# Patient Record
Sex: Female | Born: 1950 | Race: White | Hispanic: No | Marital: Married
Health system: Midwestern US, Academic
[De-identification: ages and names within clinical notes are randomized; demographics above are authoritative.]

---

## 2016-12-04 ENCOUNTER — Encounter: Admit: 2016-12-04 | Discharge: 2016-12-05 | Payer: MEDICARE

## 2016-12-10 ENCOUNTER — Encounter: Admit: 2016-12-10 | Discharge: 2016-12-11 | Payer: MEDICARE

## 2016-12-23 ENCOUNTER — Encounter: Admit: 2016-12-23 | Discharge: 2016-12-23 | Payer: MEDICARE

## 2016-12-23 DIAGNOSIS — C50912 Malignant neoplasm of unspecified site of left female breast: Principal | ICD-10-CM

## 2016-12-26 ENCOUNTER — Encounter: Admit: 2016-12-26 | Discharge: 2016-12-26 | Payer: MEDICARE

## 2016-12-28 ENCOUNTER — Encounter: Admit: 2016-12-28 | Discharge: 2016-12-28 | Payer: MEDICARE

## 2016-12-29 ENCOUNTER — Encounter: Admit: 2016-12-29 | Discharge: 2016-12-29 | Payer: MEDICARE

## 2016-12-29 DIAGNOSIS — R69 Illness, unspecified: Principal | ICD-10-CM

## 2016-12-30 ENCOUNTER — Encounter: Admit: 2016-12-30 | Discharge: 2016-12-30 | Payer: MEDICARE

## 2016-12-30 ENCOUNTER — Ambulatory Visit: Admit: 2016-12-30 | Discharge: 2016-12-31 | Payer: MEDICARE

## 2016-12-30 ENCOUNTER — Ambulatory Visit: Admit: 2016-12-30 | Discharge: 2016-12-30 | Payer: MEDICARE

## 2016-12-30 DIAGNOSIS — C50912 Malignant neoplasm of unspecified site of left female breast: ICD-10-CM

## 2016-12-30 DIAGNOSIS — K319 Disease of stomach and duodenum, unspecified: ICD-10-CM

## 2016-12-30 DIAGNOSIS — Z87891 Personal history of nicotine dependence: ICD-10-CM

## 2016-12-30 DIAGNOSIS — G629 Polyneuropathy, unspecified: ICD-10-CM

## 2016-12-30 DIAGNOSIS — H547 Unspecified visual loss: ICD-10-CM

## 2016-12-30 DIAGNOSIS — M549 Dorsalgia, unspecified: ICD-10-CM

## 2016-12-30 DIAGNOSIS — I341 Nonrheumatic mitral (valve) prolapse: ICD-10-CM

## 2016-12-30 DIAGNOSIS — M797 Fibromyalgia: ICD-10-CM

## 2016-12-30 DIAGNOSIS — E119 Type 2 diabetes mellitus without complications: Principal | ICD-10-CM

## 2016-12-30 DIAGNOSIS — E8881 Metabolic syndrome: ICD-10-CM

## 2016-12-30 DIAGNOSIS — K635 Polyp of colon: ICD-10-CM

## 2016-12-30 DIAGNOSIS — R0602 Shortness of breath: ICD-10-CM

## 2016-12-30 DIAGNOSIS — N3010 Interstitial cystitis (chronic) without hematuria: ICD-10-CM

## 2016-12-30 DIAGNOSIS — D0512 Intraductal carcinoma in situ of left breast: Principal | ICD-10-CM

## 2016-12-30 DIAGNOSIS — E039 Hypothyroidism, unspecified: ICD-10-CM

## 2016-12-30 DIAGNOSIS — M1990 Unspecified osteoarthritis, unspecified site: ICD-10-CM

## 2016-12-30 DIAGNOSIS — R42 Dizziness and giddiness: ICD-10-CM

## 2016-12-30 DIAGNOSIS — I10 Essential (primary) hypertension: ICD-10-CM

## 2016-12-30 DIAGNOSIS — H9190 Unspecified hearing loss, unspecified ear: ICD-10-CM

## 2016-12-30 DIAGNOSIS — G43909 Migraine, unspecified, not intractable, without status migrainosus: ICD-10-CM

## 2016-12-30 DIAGNOSIS — Z170 Estrogen receptor positive status [ER+]: ICD-10-CM

## 2016-12-30 DIAGNOSIS — M810 Age-related osteoporosis without current pathological fracture: ICD-10-CM

## 2016-12-30 DIAGNOSIS — I499 Cardiac arrhythmia, unspecified: ICD-10-CM

## 2016-12-30 DIAGNOSIS — K760 Fatty (change of) liver, not elsewhere classified: ICD-10-CM

## 2016-12-30 NOTE — Progress Notes
Name: Alexis Lewis          MRN: [id number]      DOB: 1950/05/29      AGE: 66 y.o.   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 She presented for screening mammogram 05/11/2016 and was told there was an abnormality and 6 month follow up was recommended.  She returned for diagnostic mammogram and ultrasound 12/04/2016 which confirmed less than 1 cm mass and biopsy was recommended.  She underwent sono-guided biopsy on 12/10/2016 which revealed grade 2 DCIS. She presents for surgical evaluation. She reports feeling a bump in her left breast after her initial screening mammogram back in March/April. She continues to feel a bump in the area of the biopsy but denies overlying skin changes, nipple discharge/inversion, and new lumps/bumps of either breast.     BREAST IMAGING:  Mammogram:    - Screening mammogram ([place]) revealed a moderate heterogenous fibroglandular parenchymal pattern.  No dominant mass lesion or architectural change.  Several benign calcifications were identified with no evidence of new suspicious aggregate pleomorphic microcalcification.   - Left diagnostic mammogram 05/28/2016 [facility]) revealed compression magnification views reveal no definite persistent concerning density. As a precaution repeat left mammogram in 6 months was recommended.   - Left diagnostic mammogram 12/04/2016 ([place]) revealed a lobulated ovoid same mass in the left breast 4 cm FTN at the 6:00 position.  - Bilateral diagnostic mammogram 12/30/2016 ([place]) revealed both breast demonstrate scattered fibroglandular densities. The right breast appears normal. At 6:00 in the left breast approximately 4-5 cm from nipple there was a small hematoma and a clip marker from recent biopsy.  Ultrasound:   - Left breast ultrasound 12/04/2016 [facility]) revealed a hypoechoic irregular mass with mild vascularity on color doppler imaging at 6:00 position 3 cm FTN.  There was posterior shadowing. No abnormal lymphadenopathy.   - Left Targeted ultrasound 12/30/2016 ([place]) revealed at this location there was a small mass measuring approximately 8 mm x 5 mm x 7 mm with associated hematoma. The clip marker seen adjacent to the small mass. The findings were consistent with recently diagnosed malignancy. No other satellite lesions or suspicious findings were present.    REPRODUCTIVE HEALTH:  Age at first Menarche:  66  Age at First Live Birth:  53  Age at Menopause:  53  Gravida:  4  Para: 3  Breastfeeding:  no    PROCEDURE: pending  PERTINENT PMH:  Fibromyalgia, colon polyps, osteoporosis, TIA-1980s, thyroidectomy, Mitral valve/tricuspid prolapse, RBBB, DM type 2, HLP  FAMILY HISTORY:  Maternal cousin - breast cancer (66). No family history of ovarian cancer  PHYSICAL EXAM on PRESENTATION:  Left Breast: biopsy changes, no palpable masses. Right Breast: no palpable masses  MEDICAL ONCOLOGY:   Will make referral   REFERRED BY:  Dr. Wilford Grist     Review of Systems   Constitutional: Positive for activity change and fatigue. Negative for appetite change, chills,  diaphoresis, fever and unexpected weight change.   HENT: Positive for dental problem, ear pain and tinnitus. Negative for congestion, ear discharge, trouble swallowing and voice change.    Eyes: Positive for photophobia and visual disturbance. Negative for discharge and itching.   Respiratory: Positive for shortness of breath. Negative for cough, chest tightness and wheezing.    Cardiovascular: Positive for palpitations and leg swelling. Negative for chest pain.   Gastrointestinal: Positive for abdominal pain, diarrhea and nausea. Negative for abdominal distention, anal bleeding, blood in stool, constipation and vomiting.   Endocrine: Negative.  Negative for cold intolerance and heat intolerance.   Genitourinary: Negative.  Negative for decreased urine volume, difficulty urinating, dysuria and urgency.   Musculoskeletal: Positive for arthralgias, back pain, myalgias, neck pain and neck stiffness. Negative for joint swelling.   Skin: Positive for rash. Negative for color change and pallor.   Allergic/Immunologic: Negative.    Neurological: Positive for dizziness, light-headedness, numbness and headaches. Negative for tremors, seizures, syncope, facial asymmetry, speech difficulty and weakness.   Hematological: Negative.    Psychiatric/Behavioral: Positive for confusion, decreased concentration and sleep disturbance. Negative for agitation and behavioral problems.     Past Medical History:   Past Medical History:   Diagnosis Date   ??? DM (diabetes mellitus), type 2    ??? Fibromyalgia    ??? HTN (hypertension)    ??? Irregular heart beat      Past Surgical History:   Past Surgical History:   Procedure Laterality Date   ??? APPENDECTOMY     ??? ELECTROCARDIOGRAM     ??? HYSTERECTOMY     ??? OVARIAN CYST REMOVAL       Allergies:   No Known Allergies    Family History:   Family History   Problem Relation Age of Onset   ??? Hypertension Mother    ??? High Cholesterol Mother    ??? Diabetes Mother      Social History:   Social History     Social History   ??? Marital status: Married     Spouse name: N/A   ??? Number of children: N/A   ??? Years of education: N/A     Occupational History   ??? Not on file.     Social History Main Topics   ??? Smoking status: Former Smoker     Packs/day: 0.25   ??? Smokeless tobacco: Not on file      Comment: Quit 25 years ago   ??? Alcohol use Yes      Comment: [employer]   ??? Drug use: No   ??? Sexual activity: Not on file     Other Topics Concern   ??? Not on file Social History Narrative   ??? No narrative on file     Objective:         ??? amitriptyline (ELAVIL) 10 mg PO tablet Take 10 mg by mouth daily.   ??? atenolol (TENORMIN) 100 mg PO tablet Take 1 Tab by mouth Daily.   ??? cyclobenzaprine (FLEXERIL) 10 mg PO tablet Take  by mouth Daily.   ??? hydrochlorothiazide (HYDRODIURIL) 25 mg tablet TAKE 1/2 TABLET BY MOUTH DAILY   ??? lisinopril (PRINIVIL, ZESTRIL) 40 mg PO tablet Take 40 mg by mouth Daily.   ??? metformin (GLUCOPHAGE) 500 mg PO tablet Take 500 mg by mouth twice daily with meals.   ??? verapamil  SR (CALAN-SR) 240 mg PO tablet Take 240 mg by mouth daily.     Vitals:  12/30/16 1316   BP: 135/74   Pulse: 66   Temp: 36.7 ???C (98 ???F)   TempSrc: Oral   SpO2: 97%   Weight: 75.8 kg (167 lb)   Height: 157.5 cm (62)     Body mass index is 30.54 kg/m???.     Pain Addressed:  N/A    Patient Evaluated for a Clinical Trial: Patient currently in screening for a treatment clinical trial.     Eastern Cooperative Oncology Group performance status is 0, Fully active, able to carry on all pre-disease performance without restriction.Marland Kitchen     Physical Exam   Constitutional: She is oriented to person, place, and time. She appears well-developed and well-nourished. No distress.   HENT:   Head: Normocephalic and atraumatic.   Right Ear: External ear normal.   Left Ear: External ear normal.   Nose: Nose normal.   Mouth/Throat: Oropharynx is clear and moist. No oropharyngeal exudate.   Eyes: Pupils are equal, round, and reactive to light. Conjunctivae and EOM are normal. Right eye exhibits no discharge. Left eye exhibits no discharge. No scleral icterus.   Neck: Normal range of motion. No tracheal deviation present.   Cardiovascular: Normal rate.    Pulmonary/Chest: Effort normal. No respiratory distress.   Abdominal: Soft. Bowel sounds are normal. She exhibits no distension. There is no tenderness. There is no rebound and no guarding. Musculoskeletal: Normal range of motion. She exhibits no edema, tenderness or deformity.   Neurological: She is alert and oriented to person, place, and time.   Skin: Skin is warm and dry. No rash noted. She is not diaphoretic. No erythema. No pallor.   Psychiatric: She has a normal mood and affect. Her behavior is normal. Judgment and thought content normal.   Nursing note and vitals reviewed.  RIGHT BREAST EXAM:  Breast:  No palpable masses  Skin Erythema:  No  Attachment of Overlying Skin:  No  Peau d' orange:  No  Chest Wall Attachment:  No  Nipple Inversion:  No  Nipple Discharge: No    LEFT BREAST EXAM:  Breast: Biopsy changes; no palpable masses  Skin Erythema:  No  Attachment of Overlying Skin:  No  Peau d' orange:  No  Chest Wall Attachment: No  Nipple Inversion:  No  Nipple Discharge:  No    RIGHT NODAL BASIN EXAM:  Axillary:  negative  Infraclavicular:  negative  Supraclavicular:  negative    LEFT NODAL BASIN EXAM:  Axillary:  negative  Infraclavicular: negative  Supraclavicular:  negative    Assessment and Plan:  66 yo F with left grade 2 DCIS (ER 99%, PR 99%) dx 12/2016.   ???  The natural history and evolution of breast cancer were discussed with Alexis Lewis and her significant other, including the difference between in -situ and invasive carcinoma, and the distinction between local and systemic disease and local and systemic therapy. For local treatment options, we explained the risks and benefits of breast conservation and mastectomy (with or without reconstruction), including the fact that survival rates are equal with these two approaches. If breast conservation is elected, we explained the need for free margins, the possibility of re -excision to achieve free margins, and the need for post-operative radiotherapy. At this time, the Alexis Lewis would not require nodal staging. With regard to systemic therapy, final recommendation will be made following receipt of final pathology post-op, but we outlined the possibility of endocrine therapy, chemotherapy, and herceptin, depending on tumor marker profile. In addition,  the indications, risks, and benefits of MRI evaluation were discussed, including the greater sensitivity of MRI at the cost of a high false positive rate, leading to additional imaging procedures and possible unnecessary biopsy. Also the lack of evidence i.e. improvement of long or short -term outcomes, and the fact that women undergoing MRI tend to have larger resections, and it is unknown if these are necessary. After this discussion, patient decided not to proceed with breast MRI at this time. Regarding surgery, Alexis Lewis is an excellent lumpectomy candidate which is how she would like to proceed at this time. As a result, the indication and process for radioactive seed localization was discussed, and the seed will be placed 1-5 days prior to surgery. She will be referred back???to a radiation oncologist following surgery if BCT is performed to discuss the benefit of WBRT. The option of mastectomy in addition of possible reconstruction was discussed with patient. We discussed that the typical process for breast reconstruction begins with tissue expander placement by a plastic surgeon at the time of mastectomy.  Expansion of the skin envelope is then accomplished by filling the expanders with saline slowly over time. Final reconstruction will take place once the desired size is achieved, and can be performed by either placing implants or by an autologous flap procedure.  ???  Following this discussion, where all of the patient's questions and concerns were addressed, we agreed to proceed with surgery after appropriate preoperative clearance.     1. Pt to OR for left RSL lumpectomy after cardiac clearance   2. Will obtain recent PCP labs to evaluate liver function, CXR given shortness of breath (ordered) 3. Cardiology referral given pt reported shortness of breath on exertion for preoperative cardiac clearance  4. Radiation Oncology referral   5. Medical Oncology referral for potential Tamoxifen therapy following surgery given ER/PR positive state     Pt seen and discussed with Dr. Leveda Anna.     Maretta Los, MD    ATTESTATION    I personally performed the key portions of the E/M visit, discussed case with resident and concur with resident documentation of history, physical exam, assessment, and treatment plan unless otherwise noted.     Patient presents for evaluation of newly diagnosed with LEFT DCIS.  She has no breast or systemic concerns.  She had mammograms in spring and 6 month follow up was recommended.  At 6 month follow up, slight change was noted and she proceeded with bx - DCIS, er/rp positive.     Imaging at [place] [facility] today - 8mm nodule at site of bx with associated hematoma. No occult disease     She does report SOB with any exertion, CP at rest.  She has not seen a cardiologist.  Previously, cardiac cath was recommended to her, but she did not have it done as she did not feel that she needed it.     She also reports some LFT abnormalities, but states that she thinks they are fine now - she follows with her PCP for this.     PE:  NAD; respiration comfortable.  2cm nodule at LEFT 600A/B - likely hematoma; no LAD     I met with the patient and her family today to discuss her recent diagnosis of breast cancer.  In summary, this is a clinical stage 0 LEFT breast cancer.  We discussed surgical options at this time.  They include a Left seed partial mastectomy  vs a Left total mastectomy/sentinel node biopsy with possible  axillary node dissection with or without immediate reconstruction.  She understands that there is equal long term survival between these options; however, there is increased recurrence with the PM option. This can be decreased with radiation therapy. She is most interested in [facility] if  possible.  Discussed the pros and cons of breast MRI. It has the potential advantage of increased sensitivity over mammogram. There is a 10-15% chance of detecting an ipsilateral breast and 3-5% chance of detecting contralateral breast chance missed by mammography. However, there is a 20% risk of false positive exam with need for additional CNB that could be negative. There has been no proven benefit regarding surgical outcomes in patients who undergo preoperative MRI. (No benefit regarding margin re -excision or local recurrence). She is comfortable proceeding without MRI.     We discussed the AFT-25 COMET trial, which is a Phase III prospective randomized trial assessing the risks and benefits of active surveillance vs operative standard of care in the setting of low risk DCIS (grade 1 or 2 DCIS without comedonecrosis or evidence of microinvasion).  However, given the mass identification on initial imaging, she is not a candidate.     We will arrange for her to see medical and radiation oncologists following surgery.     She will need to be seen by cardiology prior to any surgical intervention.     She and her husband asked thoughtful questions and I believe that they are well informed.We will proceed with scheduling.  I have supplied them with copies of our worksheets as well as the pathology reports.        Staff name:  Stanton Kidney, MD Date:  12/31/2016

## 2017-01-04 ENCOUNTER — Encounter: Admit: 2017-01-04 | Discharge: 2017-01-04 | Payer: MEDICARE

## 2017-01-05 ENCOUNTER — Encounter: Admit: 2017-01-05 | Discharge: 2017-01-05 | Payer: MEDICARE

## 2017-01-05 NOTE — Telephone Encounter
-----   Message from Alexis Carwin, MD sent at 12/30/2016  6:46 PM CST -----  CXR is fine - patient will be notified

## 2017-01-05 NOTE — Telephone Encounter
Spoke with Vaughan Basta. She understands her CXR looks fine. She has an appointment with cardiology on 12/3 for cardiac clearance. She is still thinking about her surgery options and will make a decision following this. She is not sure that she can tolerate radiation so she is considering mastectomy. I have offered an appointment for discussion. She will think about this and be back in touch once she is seen by cardiology. She was appreciative.

## 2017-01-08 ENCOUNTER — Ambulatory Visit: Admit: 2017-01-28 | Discharge: 2017-01-28 | Payer: MEDICARE

## 2017-01-08 ENCOUNTER — Encounter: Admit: 2017-01-08 | Discharge: 2017-01-08 | Payer: MEDICARE

## 2017-01-08 DIAGNOSIS — C50912 Malignant neoplasm of unspecified site of left female breast: Principal | ICD-10-CM

## 2017-01-08 MED ORDER — CEFAZOLIN INJ 1GM IVP
2 g | Freq: Once | INTRAVENOUS | 0 refills | Status: CN
Start: 2017-01-08 — End: ?

## 2017-01-11 ENCOUNTER — Encounter: Admit: 2017-01-11 | Discharge: 2017-01-11 | Payer: MEDICARE

## 2017-01-11 DIAGNOSIS — H547 Unspecified visual loss: ICD-10-CM

## 2017-01-11 DIAGNOSIS — I499 Cardiac arrhythmia, unspecified: ICD-10-CM

## 2017-01-11 DIAGNOSIS — N3010 Interstitial cystitis (chronic) without hematuria: ICD-10-CM

## 2017-01-11 DIAGNOSIS — C50912 Malignant neoplasm of unspecified site of left female breast: Secondary | ICD-10-CM

## 2017-01-11 DIAGNOSIS — E8881 Metabolic syndrome: ICD-10-CM

## 2017-01-11 DIAGNOSIS — M797 Fibromyalgia: ICD-10-CM

## 2017-01-11 DIAGNOSIS — I10 Essential (primary) hypertension: ICD-10-CM

## 2017-01-11 DIAGNOSIS — M549 Dorsalgia, unspecified: ICD-10-CM

## 2017-01-11 DIAGNOSIS — K760 Fatty (change of) liver, not elsewhere classified: ICD-10-CM

## 2017-01-11 DIAGNOSIS — M1990 Unspecified osteoarthritis, unspecified site: ICD-10-CM

## 2017-01-11 DIAGNOSIS — R42 Dizziness and giddiness: ICD-10-CM

## 2017-01-11 DIAGNOSIS — E039 Hypothyroidism, unspecified: ICD-10-CM

## 2017-01-11 DIAGNOSIS — G43909 Migraine, unspecified, not intractable, without status migrainosus: ICD-10-CM

## 2017-01-11 DIAGNOSIS — I341 Nonrheumatic mitral (valve) prolapse: ICD-10-CM

## 2017-01-11 DIAGNOSIS — H9190 Unspecified hearing loss, unspecified ear: ICD-10-CM

## 2017-01-11 DIAGNOSIS — K319 Disease of stomach and duodenum, unspecified: ICD-10-CM

## 2017-01-11 DIAGNOSIS — E119 Type 2 diabetes mellitus without complications: Principal | ICD-10-CM

## 2017-01-11 DIAGNOSIS — M810 Age-related osteoporosis without current pathological fracture: ICD-10-CM

## 2017-01-11 DIAGNOSIS — G629 Polyneuropathy, unspecified: ICD-10-CM

## 2017-01-11 DIAGNOSIS — K635 Polyp of colon: ICD-10-CM

## 2017-01-11 NOTE — Progress Notes
Date of Service: 01/11/2017    Alexis Lewis is a 66 y.o. female.       HPI       I met Alexis Lewis today.  She is a very nice 66 year old woman who is going to need some breast surgery for breast cancer.  We were asked to see her regarding her cardiovascular status.  Alexis Lewis has multiple risk factors for heart disease including diabetes of several years duration along with hypertension and hyperlipidemia.  She is unable to take any statin drugs.  Her main issue seems to be exertional shortness of breath.  If she tries to walk very far or very fast, she gets quite short of breath and has to stop.  Then finally will have some discomfort in her chest, which certainly sounds like she may be describing angina.  She has been known to have a bundle branch block and she also has a history of sinus tachycardia and been told at one time she had mitral valve prolapse, although her last echocardiogram did not show the prolapse.  Currently, she has had no syncope, although she has had bouts of vertigo.  That has been going on for several years.  She thinks years ago she had some TIAs, although that has not been an issue recently.  She did have a heart evaluation several years ago including an echo and a thallium that were normal at that point.    ([id number])           Vitals:    01/11/17 0933   Weight: 74.8 kg (164 lb 12.8 oz)   Height: 1.575 m (5' 2)     Body mass index is 30.14 kg/m???.     Past Medical History  Patient Active Problem List    Diagnosis Date Noted   ??? 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 She presented for screening mammogram 05/11/2016 and was told there was an abnormality and 6 month follow up with recommended.  She returned for diagnostic mammogram and ultrasound 12/04/2016 which confirmed mass and biopsy was recommended.  She underwent sono-guided biopsy on 12/10/2016 which revealed grade 2 DCIS.     BREAST IMAGING:  Mammogram:    - Screening mammogram ([facility]) revealed a moderate heterogenous fibroglandular parenchymal pattern.  No dominant mass lesion or architectural change.  Several benign calcifications were identified with no evidence of new suspicious aggregate pleomorphic microcalcifications.   - Left diagnostic mammogram 05/28/2016 [facility]) revealed compression magnification views reveal no definite persistent concerning density. As a precaution repeat left mammogram in 6 months was recommended.   - Left diagnostic mammogram 12/04/2016 ([facility]) revealed a lobulated ovoid same mass in the left breast 4 cm FTN at the 6:00 position.  - Bilateral diagnostic mammogram 12/30/2016 ([facility]) revealed both breast demonstrate scattered fibroglandular densities. The right breast appears normal. At 6:00 in the left breast approximately 4-5 cm from nipple there was a small hematoma and a clip marker from recent biopsy.  Ultrasound:   - Left breast ultrasound 12/04/2016 [facility]) revealed a hypoechoic irregular mass with mild vascularity on color doppler imaging at 6:00 position 3 cm FTN.  There was posterior shadowing. No abnormal lymphadenopathy.   - Left Targeted ultrasound 12/30/2016 ([facility]) revealed at  this location there was a small mass measuring approximately 8 mm x 5 mm x 7 mm with associated hematoma. The clip marker seen adjacent to the small mass. The findings were consistent with recently diagnosed malignancy. No other satellite lesions or suspicious findings were present.    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 No family history of ovarian cancer  PHYSICAL EXAM on PRESENTATION:    MEDICAL ONCOLOGY:      REFERRED BY:  Alexis Lewis        ??? DM (diabetes mellitus), type 2 (HCC)    ??? HTN (hypertension)    ??? Mitral valve prolapse    ??? Irregular heart beat          Review of Systems   Constitution: Positive for malaise/fatigue.   HENT: Positive for ear pain, hearing loss and tinnitus.    Eyes: Negative.    Cardiovascular: Positive for dyspnea on exertion.   Respiratory: Positive for shortness of breath and snoring.    Endocrine: Positive for cold intolerance, heat intolerance and polydipsia.   Skin: Positive for dry skin and unusual hair distribution.   Musculoskeletal: Positive for arthritis, back pain, falls, joint pain, muscle cramps, muscle weakness, myalgias, neck pain and stiffness.   Gastrointestinal: Positive for bloating, abdominal pain, change in bowel habit, diarrhea, flatus, heartburn and nausea.   Genitourinary: Positive for bladder incontinence, decreased libido, incomplete emptying and pelvic pain.   Neurological: Positive for disturbances in coordination, excessive daytime sleepiness, dizziness, headaches, loss of balance and paresthesias.   Psychiatric/Behavioral: Positive for memory loss. The patient has insomnia and is nervous/anxious.    Allergic/Immunologic: Positive for environmental allergies and persistent infections (UTI).   All other systems reviewed and are negative.      Physical Exam  General Appearance: no acute distress  Skin: warm, moist, no ulcers  HEENT: EOMI, mucus membranes moist and intact  Neck Veins: neck veins are flat, neck veins are not distended  Carotid Arteries: normal carotid upstroke bilaterally, no bruits  Chest Inspection: chest is normal in appearance  Auscultation/Percussion: lungs clear to auscultation, no rales, rhonchi, or wheezing Cardiac Rhythm: regular rhythm and normal rate  Cardiac Auscultation: Normal S1 & S2, no S3 or S4, no rub  Murmurs: no cardiac murmurs   Extremities: no lower extremity edema; 2+ symmetric distal pulses  Abdominal Exam: soft, non-tender, no masses, bowel sounds normal  Liver & Spleen: no organomegaly  Neurologic Exam: neurological assessment grossly intact     Cardiovascular Studies      Problems Addressed Today  Encounter Diagnoses   Name Primary?   ??? Malignant neoplasm of left female breast, unspecified estrogen receptor status, unspecified site of breast (HCC)        Assessment and Plan     Her blood pressure was elevated today at 170/102.  We took it again later at 176/106.  She does not think it has been high before, but I am concerned about her pressure.  Her lungs are clear.  The carotid arteries are clear.  The heart tones are normal.  There are no murmurs or gallop sounds and there is no edema today.  Her resting cardiogram does show a sinus rhythm with a right bundle pattern, similar to what we have seen before.     I am concerned about the [place].  I do want a repeat her regadenoson thallium study as it has been several years since one was done.  I did not think we needed to do the echo test again.  I have asked her to check her blood pressure frequently if she can, and we will check it again when she has her thallium study done.  Assuming her pressure is okay, and the thallium study is okay, I think the risk would be low for her upcoming breast surgery.  When we get the thallium study done, we will forward it to you.    ([id number])             Current Medications (including today's revisions)  ??? cranberry fruit extract (CRANBERRY CONCENTRATE PO) Take  by mouth.   ??? hydroCHLOROthiazide (HYDRODIURIL) 25 mg tablet Take 1 tablet by mouth.   ??? loperamide (IMODIUM A-D) 2 mg capsule Take 2 mg by mouth as Needed for Diarrhea. Take 2 capsules by mouth initially, followed by 1 capsule by mouth after each loose stool up to a maximum of 8 tablets in 24 hours.   ??? phenazopyridine HCl (AZO PO) Take  by mouth.   ??? phenazopyridine HCl (PHENAZOPYRIDINE PO) Take  by mouth.   ??? TURMERIC PO Take  by mouth.

## 2017-01-12 ENCOUNTER — Ambulatory Visit: Admit: 2017-01-11 | Discharge: 2017-01-12 | Payer: MEDICARE

## 2017-01-12 DIAGNOSIS — R0602 Shortness of breath: ICD-10-CM

## 2017-01-12 DIAGNOSIS — I4510 Unspecified right bundle-branch block: ICD-10-CM

## 2017-01-12 DIAGNOSIS — Z01810 Encounter for preprocedural cardiovascular examination: Principal | ICD-10-CM

## 2017-01-12 DIAGNOSIS — Z01818 Encounter for other preprocedural examination: ICD-10-CM

## 2017-01-14 ENCOUNTER — Ambulatory Visit: Admit: 2017-01-14 | Discharge: 2017-01-14 | Payer: MEDICARE

## 2017-01-14 DIAGNOSIS — I10 Essential (primary) hypertension: ICD-10-CM

## 2017-01-14 DIAGNOSIS — R0602 Shortness of breath: Principal | ICD-10-CM

## 2017-01-14 DIAGNOSIS — Z01818 Encounter for other preprocedural examination: ICD-10-CM

## 2017-01-14 MED ORDER — AMINOPHYLLINE 500 MG/20 ML IV SOLN
50 mg | INTRAVENOUS | 0 refills | Status: DC | PRN
Start: 2017-01-14 — End: 2017-01-14

## 2017-01-14 MED ORDER — SODIUM CHLORIDE 0.9 % IV SOLP
250 mL | INTRAVENOUS | 0 refills | Status: DC | PRN
Start: 2017-01-14 — End: 2017-01-14

## 2017-01-14 MED ORDER — REGADENOSON 0.4 MG/5 ML IV SYRG
.4 mg | Freq: Once | INTRAVENOUS | 0 refills | Status: DC
Start: 2017-01-14 — End: 2017-01-14
  Administered 2017-01-14: 15:00:00 0.4 mg via INTRAVENOUS

## 2017-01-14 MED ORDER — NITROGLYCERIN 0.4 MG SL SUBL
.4 mg | SUBLINGUAL | 0 refills | Status: DC | PRN
Start: 2017-01-14 — End: 2017-01-14

## 2017-01-14 MED ORDER — ALBUTEROL SULFATE 90 MCG/ACTUATION IN HFAA
2 | RESPIRATORY_TRACT | 0 refills | Status: DC | PRN
Start: 2017-01-14 — End: 2017-01-14

## 2017-01-14 NOTE — Progress Notes
Peripheral IV Insertion Note:  Patient Side: left  Line Orientation:Antecubital  IV Catheter Size: 22G  Number of Attempts:1.  IV capped and flushed with Normal Saline.  IV site without redness, swelling, or pain.  New dressing placed.    After procedure IV cannula removed intact and hemostasis achieved.

## 2017-01-18 ENCOUNTER — Encounter: Admit: 2017-01-18 | Discharge: 2017-01-18 | Payer: MEDICARE

## 2017-01-18 NOTE — Telephone Encounter
Results released on MyChart

## 2017-01-18 NOTE — Telephone Encounter
Released on Mychart

## 2017-01-18 NOTE — Telephone Encounter
-----   Message from Abigail Miyamoto, MD sent at 01/15/2017  4:08 PM CST -----  Her thallium test is ok and she is ok for surgery. Her bp is up and she needs to follow it closely.  ----- Message -----  From: Jaclynn Major, MD  Sent: 01/15/2017   3:17 PM  To: Abigail Miyamoto, MD

## 2017-01-19 ENCOUNTER — Encounter: Admit: 2017-01-19 | Discharge: 2017-01-19 | Payer: MEDICARE

## 2017-01-20 ENCOUNTER — Encounter: Admit: 2017-01-20 | Discharge: 2017-01-20 | Payer: MEDICARE

## 2017-01-20 DIAGNOSIS — D0512 Intraductal carcinoma in situ of left breast: Principal | ICD-10-CM

## 2017-01-21 ENCOUNTER — Encounter: Admit: 2017-01-21 | Discharge: 2017-01-21 | Payer: MEDICARE

## 2017-01-21 NOTE — Telephone Encounter
Spoke with Alexis Lewis regarding L RSL Lumpectomy and the confirmed surgery date of 01/28/17. She has obtained cardiac clearance as of 01/11/17. The patient was informed that she would receive a call from the surgery department the day prior to surgery to confirm time of arrival.  The patient was given detailed instructions about where and when to check in the day of surgery.    A pre op packet describing the surgical procedure and pre and post operative instructions has been given to the patient and reviewed in detail. Informed pt that we will see her 1-2 weeks post op to review final pathology report in detail and assess surgical recovery.     The patient has been informed of the medications that need to be stopped 7-10 days prior to surgery and the NPO requirements starting at midnight the night before surgery. Also informed that a driver will need to accompany pt due to the influence of anesthesia including narcotics post procedure. Informed pt that the PAT department will call to schedule pre operative appointment which will include lab work, medication review, health history, anesthesia/ surgical history, and any other additional recommended testing.      The patient verbalized understanding of the information given.  The patient was encouraged to call with any questions or concerns.    Post op visit arranged for 02/10/17.

## 2017-01-22 ENCOUNTER — Encounter: Admit: 2017-01-22 | Discharge: 2017-01-22 | Payer: MEDICARE

## 2017-01-22 DIAGNOSIS — M549 Dorsalgia, unspecified: ICD-10-CM

## 2017-01-22 DIAGNOSIS — E119 Type 2 diabetes mellitus without complications: Principal | ICD-10-CM

## 2017-01-22 DIAGNOSIS — K759 Inflammatory liver disease, unspecified: ICD-10-CM

## 2017-01-22 DIAGNOSIS — G629 Polyneuropathy, unspecified: ICD-10-CM

## 2017-01-22 DIAGNOSIS — H547 Unspecified visual loss: ICD-10-CM

## 2017-01-22 DIAGNOSIS — G4730 Sleep apnea, unspecified: ICD-10-CM

## 2017-01-22 DIAGNOSIS — I341 Nonrheumatic mitral (valve) prolapse: ICD-10-CM

## 2017-01-22 DIAGNOSIS — E039 Hypothyroidism, unspecified: ICD-10-CM

## 2017-01-22 DIAGNOSIS — G43909 Migraine, unspecified, not intractable, without status migrainosus: ICD-10-CM

## 2017-01-22 DIAGNOSIS — K929 Disease of digestive system, unspecified: ICD-10-CM

## 2017-01-22 DIAGNOSIS — K760 Fatty (change of) liver, not elsewhere classified: ICD-10-CM

## 2017-01-22 DIAGNOSIS — M797 Fibromyalgia: ICD-10-CM

## 2017-01-22 DIAGNOSIS — M810 Age-related osteoporosis without current pathological fracture: ICD-10-CM

## 2017-01-22 DIAGNOSIS — N3010 Interstitial cystitis (chronic) without hematuria: ICD-10-CM

## 2017-01-22 DIAGNOSIS — G459 Transient cerebral ischemic attack, unspecified: ICD-10-CM

## 2017-01-22 DIAGNOSIS — I499 Cardiac arrhythmia, unspecified: ICD-10-CM

## 2017-01-22 DIAGNOSIS — H9190 Unspecified hearing loss, unspecified ear: ICD-10-CM

## 2017-01-22 DIAGNOSIS — M1990 Unspecified osteoarthritis, unspecified site: ICD-10-CM

## 2017-01-22 DIAGNOSIS — E8881 Metabolic syndrome: ICD-10-CM

## 2017-01-22 DIAGNOSIS — R42 Dizziness and giddiness: ICD-10-CM

## 2017-01-22 DIAGNOSIS — I10 Essential (primary) hypertension: ICD-10-CM

## 2017-01-22 NOTE — Progress Notes
Instructed by PAT pharmacist, Joe, to have patient take only 21 units of Levemir insulin day of surgery, and hold Novolog insulin.  RN informed patient, and she verbalized understanding.

## 2017-01-22 NOTE — Pre-Anesthesia Patient Instructions
GENERAL INFORMATION    Before you come to the hospital  ??? Make arrangements for a responsible adult to drive you home and stay with you for 24 hours following surgery.  ??? Bath/Shower Instructions  ??? Wash with Dial Gold Antibacterial Bar Soap the day before and the day of your surgery from the neck down, and Do NOT apply any lotions, moisturizers, powders, under arm deodorant or perfumes on your body after each shower.  ??? Leave money, credit cards, jewelry, and any other valuables at home. [facility] is not responsible for the loss or breakage of personal items.  ??? Remove nail polish from surgery site/extremity, makeup and all jewelry (including piercings) before coming to the hospital.  ??? The morning of your procedure:  ??? brush your teeth and tongue  ??? do not smoke  ??? do not shave the area where you will have surgery    What to bring to the hospital  ??? ID/ Insurance Card  ??? Medical Device card  ??? Official documents for legal guardianship   ??? Copy of your Living Will, Advanced Directives, and/or Durable Power of Attorney   ??? Small bag with a few personal belongings  ??? Cases for glasses/hearing aids/contact lens (bring solutions for contacts)  ??? Dress in clean, loose, comfortable clothing   ??? Other personal items such as canes, walkers, and medications in original containers if applicable  ??? CPAP or BiPAP Machine: If it is a [employer]ips/Respironics System One machine please bring machine/humidifier and tubing/mask. All other brands, please bring tubing/mask only on the day of surgery.     Eating or drinking before surgery  ??? Do not eat anything after 11:00 p.m. the day before your procedure (including gum, mints, candy, or chewing tobacco).     Other instructions  Notify your surgeon if:  ??? there is a possibility that you are pregnant  ??? you become ill with a cough, fever, sore throat, nausea, vomiting or flu-like symptoms  ??? you have any open wounds/sores that are red, painful, draining, or are new since you last saw  the doctor  ??? you need to cancel your procedure    Notify us at [facility]: [phone]  ??? if you need to cancel your procedure  ??? if you are going to be late    Arrival at the hospital  [place] - Your surgery is scheduled on 01/28/17 at 2:00 p.m.  Please arrive at 12:00 p.m.  ??? The [address] is located at [employer]. This is at the [employer] of [address]  ??? Use the main entrance of the hospital, at the east side of the building. Parking is free.  ??? Check-in for surgery is inside the main entrance.

## 2017-01-22 NOTE — Progress Notes
Spoke with pt to confirm seed placement on 01/27/17. Advised pt to register 1 hour prior to scheduled appt time.  Pt surgery date is 01/28/17.  Pt has no questions or concerns at this time

## 2017-01-25 NOTE — Progress Notes
Dr. 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  Records reviewed included 01/18/17 MAC risk stratification for surgery, 01/14/17 stress test, 01/12/17 office note, 01/11/17 EKG, and ok to proceed with surgery at [facility] on 01/28/17.

## 2017-01-27 ENCOUNTER — Ambulatory Visit: Admit: 2017-01-27 | Discharge: 2017-01-27 | Payer: MEDICARE

## 2017-01-27 ENCOUNTER — Ambulatory Visit: Admit: 2017-01-27 | Discharge: 2017-01-28 | Payer: MEDICARE

## 2017-01-27 DIAGNOSIS — D0512 Intraductal carcinoma in situ of left breast: Principal | ICD-10-CM

## 2017-01-27 MED ORDER — LIDOCAINE 1% (BUFFERED) SYRINGE (BREAST CENTER)
3 mL | Freq: Once | INTRAMUSCULAR | 0 refills | Status: CP
Start: 2017-01-27 — End: ?

## 2017-01-28 ENCOUNTER — Encounter: Admit: 2017-01-28 | Discharge: 2017-01-28 | Payer: MEDICARE

## 2017-01-28 ENCOUNTER — Ambulatory Visit: Admit: 2017-01-28 | Discharge: 2017-01-28 | Payer: MEDICARE

## 2017-01-28 DIAGNOSIS — G629 Polyneuropathy, unspecified: ICD-10-CM

## 2017-01-28 DIAGNOSIS — I10 Essential (primary) hypertension: ICD-10-CM

## 2017-01-28 DIAGNOSIS — G4730 Sleep apnea, unspecified: ICD-10-CM

## 2017-01-28 DIAGNOSIS — C50912 Malignant neoplasm of unspecified site of left female breast: Principal | ICD-10-CM

## 2017-01-28 DIAGNOSIS — E119 Type 2 diabetes mellitus without complications: Principal | ICD-10-CM

## 2017-01-28 DIAGNOSIS — Z86010 Personal history of colonic polyps: ICD-10-CM

## 2017-01-28 DIAGNOSIS — M549 Dorsalgia, unspecified: ICD-10-CM

## 2017-01-28 DIAGNOSIS — E039 Hypothyroidism, unspecified: ICD-10-CM

## 2017-01-28 DIAGNOSIS — Z8673 Personal history of transient ischemic attack (TIA), and cerebral infarction without residual deficits: ICD-10-CM

## 2017-01-28 DIAGNOSIS — M797 Fibromyalgia: ICD-10-CM

## 2017-01-28 DIAGNOSIS — I499 Cardiac arrhythmia, unspecified: ICD-10-CM

## 2017-01-28 DIAGNOSIS — H547 Unspecified visual loss: ICD-10-CM

## 2017-01-28 DIAGNOSIS — E8881 Metabolic syndrome: ICD-10-CM

## 2017-01-28 DIAGNOSIS — I341 Nonrheumatic mitral (valve) prolapse: ICD-10-CM

## 2017-01-28 DIAGNOSIS — Z794 Long term (current) use of insulin: ICD-10-CM

## 2017-01-28 DIAGNOSIS — G459 Transient cerebral ischemic attack, unspecified: ICD-10-CM

## 2017-01-28 DIAGNOSIS — Z170 Estrogen receptor positive status [ER+]: ICD-10-CM

## 2017-01-28 DIAGNOSIS — G43909 Migraine, unspecified, not intractable, without status migrainosus: ICD-10-CM

## 2017-01-28 DIAGNOSIS — K929 Disease of digestive system, unspecified: ICD-10-CM

## 2017-01-28 DIAGNOSIS — M810 Age-related osteoporosis without current pathological fracture: ICD-10-CM

## 2017-01-28 DIAGNOSIS — M1990 Unspecified osteoarthritis, unspecified site: ICD-10-CM

## 2017-01-28 DIAGNOSIS — K760 Fatty (change of) liver, not elsewhere classified: ICD-10-CM

## 2017-01-28 DIAGNOSIS — K759 Inflammatory liver disease, unspecified: ICD-10-CM

## 2017-01-28 DIAGNOSIS — H9190 Unspecified hearing loss, unspecified ear: ICD-10-CM

## 2017-01-28 DIAGNOSIS — N3010 Interstitial cystitis (chronic) without hematuria: ICD-10-CM

## 2017-01-28 DIAGNOSIS — R42 Dizziness and giddiness: ICD-10-CM

## 2017-01-28 LAB — POC GLUCOSE
Lab: 132 mg/dL — ABNORMAL HIGH (ref 70–100)
Lab: 132 mg/dL — ABNORMAL HIGH (ref 70–100)

## 2017-01-28 MED ORDER — ONDANSETRON HCL (PF) 4 MG/2 ML IJ SOLN
4 mg | Freq: Once | INTRAVENOUS | 0 refills | Status: DC | PRN
Start: 2017-01-28 — End: 2017-01-28

## 2017-01-28 MED ORDER — HALOPERIDOL LACTATE 5 MG/ML IJ SOLN
1 mg | Freq: Once | INTRAVENOUS | 0 refills | Status: DC | PRN
Start: 2017-01-28 — End: 2017-01-28

## 2017-01-28 MED ORDER — FENTANYL CITRATE (PF) 50 MCG/ML IJ SOLN
50 ug | INTRAVENOUS | 0 refills | Status: DC | PRN
Start: 2017-01-28 — End: 2017-01-28

## 2017-01-28 MED ORDER — SENNOSIDES-DOCUSATE SODIUM 8.6-50 MG PO TAB
1 | ORAL_TABLET | Freq: Two times a day (BID) | ORAL | 1 refills | Status: AC
Start: 2017-01-28 — End: 2017-02-10

## 2017-01-28 MED ORDER — MIDAZOLAM 1 MG/ML IJ SOLN
INTRAVENOUS | 0 refills | Status: DC
Start: 2017-01-28 — End: 2017-01-28
  Administered 2017-01-28: 20:00:00 2 mg via INTRAVENOUS

## 2017-01-28 MED ORDER — BUPIVACAINE 0.25 % (2.5 MG/ML) IJ SOLN
0 refills | Status: DC
Start: 2017-01-28 — End: 2017-01-28
  Administered 2017-01-28: 20:00:00 10 mL via INTRAMUSCULAR

## 2017-01-28 MED ORDER — FENTANYL CITRATE (PF) 50 MCG/ML IJ SOLN
25 ug | INTRAVENOUS | 0 refills | Status: DC | PRN
Start: 2017-01-28 — End: 2017-01-28

## 2017-01-28 MED ORDER — HYDROCODONE-ACETAMINOPHEN 5-325 MG PO TAB
1-2 | ORAL_TABLET | ORAL | 0 refills | 15.00000 days | Status: AC | PRN
Start: 2017-01-28 — End: 2017-02-10
  Filled 2017-01-28 (×2): qty 32, 4d supply, fill #1

## 2017-01-28 MED ORDER — MEPERIDINE (PF) 25 MG/ML IJ SYRG
12.5 mg | INTRAVENOUS | 0 refills | Status: DC | PRN
Start: 2017-01-28 — End: 2017-01-28

## 2017-01-28 MED ORDER — LACTATED RINGERS IV SOLP
INTRAVENOUS | 0 refills | Status: DC
Start: 2017-01-28 — End: 2017-01-28
  Administered 2017-01-28: 20:00:00 1000.000 mL via INTRAVENOUS

## 2017-01-28 MED ORDER — DIPHENHYDRAMINE HCL 50 MG/ML IJ SOLN
25 mg | Freq: Once | INTRAVENOUS | 0 refills | Status: DC | PRN
Start: 2017-01-28 — End: 2017-01-28

## 2017-01-28 MED ORDER — DEXAMETHASONE SODIUM PHOSPHATE 4 MG/ML IJ SOLN
INTRAVENOUS | 0 refills | Status: DC
Start: 2017-01-28 — End: 2017-01-28
  Administered 2017-01-28: 20:00:00 8 mg via INTRAVENOUS

## 2017-01-28 MED ORDER — ONDANSETRON HCL (PF) 4 MG/2 ML IJ SOLN
INTRAVENOUS | 0 refills | Status: DC
Start: 2017-01-28 — End: 2017-01-28
  Administered 2017-01-28: 20:00:00 4 mg via INTRAVENOUS

## 2017-01-28 MED ORDER — PROPOFOL INJ 10 MG/ML IV VIAL
0 refills | Status: DC
Start: 2017-01-28 — End: 2017-01-28
  Administered 2017-01-28: 20:00:00 200 mg via INTRAVENOUS

## 2017-01-28 MED ORDER — LIDOCAINE (PF) 200 MG/10 ML (2 %) IJ SYRG
0 refills | Status: DC
Start: 2017-01-28 — End: 2017-01-28
  Administered 2017-01-28: 20:00:00 100 mg via INTRAVENOUS

## 2017-01-28 MED ORDER — CEFAZOLIN INJ 1GM IVP
2 g | Freq: Once | INTRAVENOUS | 0 refills | Status: CP
Start: 2017-01-28 — End: ?
  Administered 2017-01-28: 20:00:00 2 g via INTRAVENOUS

## 2017-01-28 MED ORDER — LIDOCAINE (PF) 10 MG/ML (1 %) IJ SOLN
.1-2 mL | INTRAMUSCULAR | 0 refills | Status: DC | PRN
Start: 2017-01-28 — End: 2017-01-28

## 2017-01-28 MED ORDER — FENTANYL CITRATE (PF) 50 MCG/ML IJ SOLN
0 refills | Status: DC
Start: 2017-01-28 — End: 2017-01-28
  Administered 2017-01-28: 20:00:00 50 ug via INTRAVENOUS

## 2017-01-28 MED ORDER — OXYCODONE 5 MG PO TAB
5-10 mg | Freq: Once | ORAL | 0 refills | Status: CP | PRN
Start: 2017-01-28 — End: ?
  Administered 2017-01-28: 21:00:00 10 mg via ORAL

## 2017-01-28 NOTE — Operative Report(Direct Entry)
OPERATIVE REPORT    Name: Alexis Lewis is a 66 y.o. female     DOB: 06-Sep-1950             MRN#: [id number]    DATE OF OPERATION: 01/28/2017    Surgeon(s) and Role:     Leveda Anna, Karion Cudd, MD - Primary     * Rexford Maus., MD - Resident - Assisting        Preoperative Diagnosis:    Malignant neoplasm of left breast in female, estrogen receptor positive, unspecified site of breast (HCC) [C50.912, Z17.0]  Cancer Staging  Ductal carcinoma in situ (DCIS) of left breast  Staging form: Breast, AJCC 8th Edition  - Clinical stage from 12/30/2016: Stage 0 (cTis (DCIS), cN0, cM0, ER: Positive, PR: Positive, HER2: Not assessed ) - Signed by Alease Medina, PA-C on 12/30/2016      Post-op Diagnosis      * Malignant neoplasm of left breast in female, estrogen receptor positive, unspecified site of breast (HCC) [C50.912, Z17.0]    Procedure(s) (LRB):  LEFT RADIOACTIVE SEED LOCALIZED LUMPECTOMY (Left)    Anesthesia Type: General      INDICATIONS FOR OPERATIVE PROCEDURE: The patient is a very pleasant, 66 year-old lady, who was diagnosed with stage 0 LEFT breast cancer. We have discussed surgical treatment options at length. At this point, she wishes to proceed breast conserving therapy. The risks and benefits of these procedures were discussed with her in detail. Informed consent was obtained as in the chart    DESCRIPTION OF OPERATIVE PROCEDURE: The patient was taken to the operating room and placed in supine position on the operating table. Monitoring lines were placed. General anesthetic was induced.       Her LEFT breast and upper arm were then prepped and draped in a sterile fashion. She received perioperative antibiotics.      PARTIAL MASTECTOMY  I directed my attention to the LEFT  breast. She had undergone previous seed  localization of the clip and nodularity  at the 600B o'clock position. A small skin incision was made adjacent to the inframammary fold. The underlying tissues were dissected and the area of increased I 125 uptake was further  identified.  The surrounding tissues were then grasped with an Allis clamp. The tissue surrounding the hotspot was then widely excised to ensure clearance of the margins. Once the tissues were completely excised, the specimen was oriented superiorly with a short silk suture and laterally with a long silk suture. There was increased uptake in the specimen and no uptake in the bed.  It was sent for specimen radiograph. During this time, the wound was irrigated. Meticulous hemostasis was achieved. The wound was inspected. There were no palpable abnormalities. Shave margins were taken superiorly, medially, laterally, posteriorly and anteriorly.  the inferior was not excised as this appeared to be widely removed from the area of increased uptake.  True margins were marked with clips.  Local was infiltrated for postoperative pain control. Clips were applied posteriorly at the superior, inferomedial, and inferolateral apices of the partial mastectomy bed. The deeper tissue was re approximated with 2-0 PDS.  The dermal edges of the wound were then reapproximated with 3-0 Vicryl and the skin was closed in subcuticular fashion with 4-0 Monocryl.     CLOSURE  At this time, the specimen radiograph returned with confirmation of the clip and seed of concern. I personally reviewed the specimen radiograph. Therabond was placed. Sterile dressings were placed. The patient was  allowed to awaken from her anesthetic and was taken to the recovery room in stable condition. All needle, sponge, and instrument counts were correct.      Estimated Blood Loss:  No blood loss documented.     Specimen(s) Removed/Disposition:   ID Type Source Tests Collected by Time Destination   1 : LEFT BREAST RADIOACTIVE SEED LOCALIZED LUMPECTOMY (1 CLIP, 1 SEED) STITCH SHORT SUPERIOR, LONG LATERAL, FOR IMMEDIATE SPECIMEN RADIOGRAPH Tissue Breast,Left SURGICAL PATHOLOGY          Stanton Kidney, MD 01/28/2017 1405 2 : ADDITIONAL LEFT ANTERIOR MARGIN, CLIP MARKS TRUE MARGIN Tissue Breast,Left SURGICAL PATHOLOGY          Stanton Kidney, MD 01/28/2017 1410    3 : ADDITIONAL LEFT POSTERIOR MARGIN, CLIP MARKS TRUE MARGIN Tissue Breast,Left SURGICAL PATHOLOGY          Stanton Kidney, MD 01/28/2017 1410    4 : ADDITIONAL LEFT SUPERIOR MARGIN, CLIP MARKS TRUE MARGIN Tissue Breast,Left SURGICAL PATHOLOGY          Stanton Kidney, MD 01/28/2017 1410    5 : ADDITIONAL LEFT MEDIAL MARGIN, CLIP MARKS TRUE MARGIN Tissue Breast,Left SURGICAL PATHOLOGY          Stanton Kidney, MD 01/28/2017 1410    6 : ADDITIONAL LEFT LATERAL MARGIN, CLIP MARKS TRUE MARGIN Tissue Breast,Left SURGICAL PATHOLOGY          Stanton Kidney, MD 01/28/2017 1408        Attestation: I performed this procedure with a resident. and I was present for the entire procedure.    Stanton Kidney, MD  Pager

## 2017-01-28 NOTE — Progress Notes
PATIENT STABLE HUSBAND AT BEDSIDE

## 2017-01-28 NOTE — H&P (View-Only)
Admission History and Physical Examination      Name:  Alexis Lewis                                             MRN:  [id number]   Admission Date:  (Not on file)                     Assessment/Plan:    Active Problems:    * No active hospital problems. 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    __________________________________________________________________________________  Primary Care Physician: Wilford Grist  Verified    Chief Complaint:  LEFT DCIS    History of Present Illness: CAITILIN Lewis is a 66 y.o. female presented for screening mammogram 05/11/2016 and was told there was an abnormality and 6 month follow up was recommended.  She returned for diagnostic mammogram and ultrasound 12/04/2016 which confirmed less than 1 cm mass and biopsy was recommended.  She underwent sono-guided biopsy on 12/10/2016 which revealed grade 2 DCIS. She presents for surgical evaluation. She reports feeling a bump in her left breast after her initial screening mammogram back in March/April. She continues to feel a bump in the area of the biopsy but denies overlying skin changes, nipple discharge/inversion, and new lumps/bumps of either breast.   ???  We discussed the AFT-25 COMET trial, which is a Phase III prospective randomized trial assessing the risks and benefits of active surveillance vs operative standard of care in the setting of low risk DCIS (grade 1 or 2 DCIS without comedonecrosis or evidence of microinvasion).  However, given the mass identification on initial imaging, she is not a candidate    She would like to proceed with BCT.   She has been evaluated by cardiology and has been cleared. Stress test has normal EF, no evidence of ischemia.   BREAST IMAGING:  Mammogram:    - Screening mammogram ([place]) revealed a moderate heterogenous fibroglandular parenchymal pattern.  No dominant mass lesion or architectural change.  Several benign calcifications were identified with no evidence of new suspicious aggregate pleomorphic microcalcification.   - Left diagnostic mammogram 05/28/2016 [facility]) revealed compression magnification views reveal no definite persistent concerning density. As a precaution repeat left mammogram in 6 months was recommended.   - Left diagnostic mammogram 12/04/2016 ([place]) revealed a lobulated ovoid same mass in the left breast 4 cm FTN at the 6:00 position.  - Bilateral diagnostic mammogram 12/30/2016 ([place]) revealed both breast demonstrate scattered fibroglandular densities. The right breast appears normal. At 6:00 in the left breast approximately 4-5 cm from nipple there was a small hematoma and a clip marker from recent biopsy.  Ultrasound:   - Left breast ultrasound 12/04/2016 [facility]) revealed a hypoechoic irregular mass with mild vascularity on color doppler imaging at 6:00 position 3 cm FTN.  There was posterior shadowing. No  abnormal lymphadenopathy.   - Left Targeted ultrasound 12/30/2016 ([facility]) revealed at this location there was a small mass measuring approximately 8 mm x 5 mm x 7 mm with associated hematoma. The clip marker seen adjacent to the small mass. The findings were consistent with recently diagnosed malignancy. No other satellite lesions or suspicious findings were present.  ???  REPRODUCTIVE HEALTH:  Age at first Menarche:  85  Age at First Live Birth:  45  Age at Menopause:  35  Gravida:  4  Para: 3  Breastfeeding:  no  ???  PROCEDURE: pending PERTINENT PMH:  Fibromyalgia, colon polyps, osteoporosis, TIA-1980s, thyroidectomy, Mitral valve/tricuspid prolapse, RBBB, DM type 2, HLP  FAMILY HISTORY:  Maternal cousin - breast cancer (66). No family history of ovarian cancer  PHYSICAL EXAM on PRESENTATION:  Left Breast: biopsy changes, no palpable masses. Right Breast: no palpable masses  MEDICAL ONCOLOGY:   Will make referral         Past Medical History:   Diagnosis Date   ??? Acquired hypothyroidism    ??? Arthritis    ??? Back pain    ??? DM (diabetes mellitus), type 2 (HCC)    ??? Fatty liver    ??? Fibromyalgia    ??? Fibromyalgia    ??? Gastrointestinal disorder     colon polyps   ??? Hearing reduced    ??? Hepatitis 1960's    Hep A   ??? HTN (hypertension)    ??? Interstitial cystitis    ??? Irregular heart beat     ST and BBB   ??? Metabolic syndrome    ??? Migraines    ??? Mitral valve prolapse    ??? Neuropathy    ??? Osteoporosis    ??? Sleep apnea     doesn't have CPAP machine   ??? TIA (transient ischemic attack) 1970's-1980's    TIA's - pt states multiple    ??? Vertigo    ??? Vision decreased      Past Surgical History:   Procedure Laterality Date   ??? HX APPENDECTOMY  1981   ??? HX TONSILLECTOMY  1988   ??? THYROIDECTOMY  2015   ??? COLONOSCOPY     ??? HX HYSTERECTOMY  1990's   ??? OVARIAN CYST REMOVAL  1990's     Family History   Problem Relation Age of Onset   ??? Hypertension Mother    ??? High Cholesterol Mother    ??? Diabetes Mother    ??? Migraines Mother    ??? Heart Disease Father    ??? High Cholesterol Father    ??? Arthritis-rheumatoid Father    ??? Rashes/Skin Problems Father    ??? Diabetes Sister    ??? Hypertension Sister    ??? Rashes/Skin Problems Sister    ??? Diabetes Maternal Grandmother    ??? Diabetes Maternal Grandfather      Social History     Socioeconomic History   ??? Marital status: Married     Spouse name: Not on file   ??? Number of children: Not on file   ??? Years of education: Not on file   ??? Highest education level: Not on file   Social Needs   ??? Financial resource strain: Not on file ??? Food insecurity - worry: Not on file   ??? Food insecurity - inability: Not on file   ??? Transportation needs - medical: Not on file   ??? Transportation needs - non-medical: Not on file   Occupational History   ???  Not on file   Tobacco Use   ??? Smoking status: Never Smoker   ??? Smokeless tobacco: Never Used   Substance and Sexual Activity   ??? Alcohol use: No     Frequency: Never   ??? Drug use: No   ??? Sexual activity: Not on file   Other Topics Concern   ??? Not on file   Social History Narrative   ??? Not on file      Immunizations (includes history and patient reported):   There is no immunization history on file for this patient.        Allergies:  Adhesive; Ciprofloxacin; and Latex    Medications:  No current facility-administered medications for this encounter.      Current Outpatient Medications   Medication Sig   ??? cranberry fruit 400 mg tab Take  by mouth.   ??? hydroCHLOROthiazide (HYDRODIURIL) 25 mg tablet Take 1 tablet by mouth.   ??? insulin aspart U-100 (NOVOLOG U-100 INSULIN ASPART) 100 unit/mL injection Inject 10 Units under the skin three times daily with meals.   ??? insulin detemir(+) (LEVEMIR) 100 unit/mL Inject 27 Units under the skin daily.   ??? levothyroxine (SYNTHROID) 125 mcg tablet Take 125 mcg by mouth daily 30 minutes before breakfast.   ??? loperamide (IMODIUM A-D) 2 mg capsule Take 2 mg by mouth as Needed for Diarrhea. Take 2 capsules by mouth initially, followed by 1 capsule by mouth after each loose stool up to a maximum of 8 tablets in 24 hours.   ??? losartan(+) (COZAAR) 100 mg tablet Take 100 mg by mouth daily.   ??? metoprolol XL (TOPROL XL) 50 mg extended release tablet Take 50 mg by mouth daily.   ??? turmeric 400 mg cap Take  by mouth.     Review of Systems:  Constitutional: Positive for activity change and fatigue. Negative for appetite change, chills, diaphoresis, fever and unexpected weight change.   HENT: Positive for dental problem, ear pain and tinnitus. Negative for congestion, ear discharge, trouble swallowing and voice change.    Eyes: Positive for photophobia and visual disturbance. Negative for discharge and itching.   Respiratory: Positive for shortness of breath. Negative for cough, chest tightness and wheezing.    Cardiovascular: Positive for palpitations and leg swelling. Negative for chest pain.   Gastrointestinal: Positive for abdominal pain, diarrhea and nausea. Negative for abdominal distention, anal bleeding, blood in stool, constipation and vomiting.   Endocrine: Negative.  Negative for cold intolerance and heat intolerance.   Genitourinary: Negative.  Negative for decreased urine volume, difficulty urinating, dysuria and urgency.   Musculoskeletal: Positive for arthralgias, back pain, myalgias, neck pain and neck stiffness. Negative for joint swelling.   Skin: Positive for rash. Negative for color change and pallor.   Allergic/Immunologic: Negative.    Neurological: Positive for dizziness, light- headedness, numbness and headaches. Negative for tremors, seizures, syncope, facial asymmetry, speech difficulty and weakness.   Hematological: Negative.    Psychiatric/Behavioral: Positive for confusion, decreased concentration and sleep disturbance. Negative for agitation and behavioral problems.   ???        Physical Exam:  Constitutional: She is oriented to person, place, and time. She appears well -developed and well -nourished. No distress.   HENT:   Head: Normocephalic and atraumatic.   Right Ear: External ear normal.   Left Ear: External ear normal.   Nose: Nose normal.   Mouth/Throat: Oropharynx is clear and moist. No oropharyngeal exudate.   Eyes: Pupils are equal, round,  and reactive to light. Conjunctivae and EOM are normal. Right eye exhibits no discharge. Left eye exhibits no discharge. No scleral icterus.   Neck: Normal range of motion. No tracheal deviation present.   Cardiovascular: Normal rate.    Pulmonary/Chest: Effort normal. No respiratory distress. Abdominal: Soft. Bowel sounds are normal. She exhibits no distension. There is no tenderness. There is no rebound and no guarding.   Musculoskeletal: Normal range of motion. She exhibits no edema, tenderness or deformity.   Neurological: She is alert and oriented to person, place, and time.   Skin: Skin is warm and dry. No rash noted. She is not diaphoretic. No erythema. No pallor.   Psychiatric: She has a normal mood and affect. Her behavior is normal. Judgment and thought content normal.   Nursing note and vitals reviewed.  RIGHT BREAST EXAM:  Breast:  No palpable masses  Skin Erythema:  No  Attachment of Overlying Skin:  No  Peau d' orange:  No  Chest Wall Attachment:  No  Nipple Inversion:  No  Nipple Discharge: No  ???  LEFT BREAST EXAM:  Breast: Biopsy changes; no palpable masses  Skin Erythema:  No  Attachment of Overlying Skin:  No  Peau d' orange:  No  Chest Wall Attachment: No  Nipple Inversion:  No  Nipple Discharge:  No  ???  RIGHT NODAL BASIN EXAM:  Axillary:  negative  Infraclavicular:  negative  Supraclavicular:  negative  ???  LEFT NODAL BASIN EXAM:  Axillary:  negative  Infraclavicular: negative  Supraclavicular:  negative      Lab/Radiology/Other Diagnostic Tests:  Pertinent labs reviewed     Pertinent radiology reviewed.    Stanton Kidney, MD  Pager

## 2017-01-28 NOTE — Interval H&P Note
History and Physical Update Note    Allergies:  Adhesive; Ciprofloxacin; and Latex    Point of Care Testing:  (Last 24 hours):  POC Glucose (Download): (!) 132 (01/28/17 1245)      I have examined the patient, and there are no significant changes in their condition, from the previous H&P performed on 01/27/17.     L. Olevia Bowens, MD  Pager [phone]    --------------------------------------------------------------------------------------------------------------------------------------------

## 2017-01-28 NOTE — Anesthesia Post-Procedure Evaluation
Post-Anesthesia Evaluation    Name: Alexis Lewis      MRN: [id number]     DOB: 1950/03/11     Age: 66 y.o.     Sex: female   __________________________________________________________________________     Procedure Date: 01/28/2017  Procedure: Procedure(s) with comments:  LEFT RADIOACTIVE SEED LOCALIZED LUMPECTOMY - CASE LENGTH 1 HOUR      Surgeon: Surgeon(s):  Hart Carwin, MD  Mare Loan., MD    Post-Anesthesia Vitals  BP: 181/79 (12/20 1515)  Temp: 36.7 C (98 F) (12/20 1230)  Pulse: 62 (12/20 1515)  Respirations: 20 PER MINUTE (12/20 1515)  SpO2: 94 % (12/20 1515)  O2 Delivery: None (Room Air) (12/20 1230)  SpO2 Pulse: 63 (12/20 1515)  Height: 157.5 cm (62") (12/20 1230)      Post Anesthesia Evaluation Note    Evaluation location: pre/post  Patient participation: recovered; patient participated in evaluation  Level of consciousness: alert    Pain score: 3  Pain management: adequate    Hydration: normovolemia  Temperature: 36.0C - 38.4C  Airway patency: adequate    Perioperative Events  Perioperative events:  no       Post-op nausea and vomiting: no PONV    Postoperative Status  Cardiovascular status: hemodynamically stable  Respiratory status: spontaneous ventilation        Perioperative Events  Perioperative Event: No  Emergency Case Activation: No

## 2017-02-01 ENCOUNTER — Encounter: Admit: 2017-02-01 | Discharge: 2017-02-01 | Payer: MEDICARE

## 2017-02-01 NOTE — Telephone Encounter
Pathology:  LEFT = DCIS, 2.4cm margins clear     I have spoken to the patient   Follow up as planned

## 2017-02-03 ENCOUNTER — Encounter: Admit: 2017-02-03 | Discharge: 2017-02-03 | Payer: MEDICARE

## 2017-02-03 DIAGNOSIS — I10 Essential (primary) hypertension: ICD-10-CM

## 2017-02-03 DIAGNOSIS — M797 Fibromyalgia: ICD-10-CM

## 2017-02-03 DIAGNOSIS — R42 Dizziness and giddiness: ICD-10-CM

## 2017-02-03 DIAGNOSIS — N3010 Interstitial cystitis (chronic) without hematuria: ICD-10-CM

## 2017-02-03 DIAGNOSIS — M1990 Unspecified osteoarthritis, unspecified site: ICD-10-CM

## 2017-02-03 DIAGNOSIS — I499 Cardiac arrhythmia, unspecified: ICD-10-CM

## 2017-02-03 DIAGNOSIS — H9190 Unspecified hearing loss, unspecified ear: ICD-10-CM

## 2017-02-03 DIAGNOSIS — E039 Hypothyroidism, unspecified: ICD-10-CM

## 2017-02-03 DIAGNOSIS — M810 Age-related osteoporosis without current pathological fracture: ICD-10-CM

## 2017-02-03 DIAGNOSIS — H547 Unspecified visual loss: ICD-10-CM

## 2017-02-03 DIAGNOSIS — G4730 Sleep apnea, unspecified: ICD-10-CM

## 2017-02-03 DIAGNOSIS — G629 Polyneuropathy, unspecified: ICD-10-CM

## 2017-02-03 DIAGNOSIS — E8881 Metabolic syndrome: ICD-10-CM

## 2017-02-03 DIAGNOSIS — M549 Dorsalgia, unspecified: ICD-10-CM

## 2017-02-03 DIAGNOSIS — E119 Type 2 diabetes mellitus without complications: Principal | ICD-10-CM

## 2017-02-03 DIAGNOSIS — G43909 Migraine, unspecified, not intractable, without status migrainosus: ICD-10-CM

## 2017-02-03 DIAGNOSIS — K760 Fatty (change of) liver, not elsewhere classified: ICD-10-CM

## 2017-02-03 DIAGNOSIS — K759 Inflammatory liver disease, unspecified: ICD-10-CM

## 2017-02-03 DIAGNOSIS — I341 Nonrheumatic mitral (valve) prolapse: ICD-10-CM

## 2017-02-03 DIAGNOSIS — G459 Transient cerebral ischemic attack, unspecified: ICD-10-CM

## 2017-02-03 DIAGNOSIS — K929 Disease of digestive system, unspecified: ICD-10-CM

## 2017-02-09 ENCOUNTER — Ambulatory Visit: Admit: 2017-02-09 | Discharge: 2017-02-23 | Payer: MEDICARE

## 2017-02-10 ENCOUNTER — Encounter: Admit: 2017-02-10 | Discharge: 2017-02-10 | Payer: MEDICARE

## 2017-02-10 DIAGNOSIS — M549 Dorsalgia, unspecified: ICD-10-CM

## 2017-02-10 DIAGNOSIS — H547 Unspecified visual loss: ICD-10-CM

## 2017-02-10 DIAGNOSIS — G43909 Migraine, unspecified, not intractable, without status migrainosus: ICD-10-CM

## 2017-02-10 DIAGNOSIS — G629 Polyneuropathy, unspecified: ICD-10-CM

## 2017-02-10 DIAGNOSIS — E119 Type 2 diabetes mellitus without complications: Principal | ICD-10-CM

## 2017-02-10 DIAGNOSIS — R42 Dizziness and giddiness: ICD-10-CM

## 2017-02-10 DIAGNOSIS — D0512 Intraductal carcinoma in situ of left breast: Principal | ICD-10-CM

## 2017-02-10 DIAGNOSIS — G459 Transient cerebral ischemic attack, unspecified: ICD-10-CM

## 2017-02-10 DIAGNOSIS — I10 Essential (primary) hypertension: ICD-10-CM

## 2017-02-10 DIAGNOSIS — I341 Nonrheumatic mitral (valve) prolapse: ICD-10-CM

## 2017-02-10 DIAGNOSIS — E039 Hypothyroidism, unspecified: ICD-10-CM

## 2017-02-10 DIAGNOSIS — I499 Cardiac arrhythmia, unspecified: ICD-10-CM

## 2017-02-10 DIAGNOSIS — H9190 Unspecified hearing loss, unspecified ear: ICD-10-CM

## 2017-02-10 DIAGNOSIS — K929 Disease of digestive system, unspecified: ICD-10-CM

## 2017-02-10 DIAGNOSIS — M797 Fibromyalgia: ICD-10-CM

## 2017-02-10 DIAGNOSIS — M1990 Unspecified osteoarthritis, unspecified site: ICD-10-CM

## 2017-02-10 DIAGNOSIS — K760 Fatty (change of) liver, not elsewhere classified: ICD-10-CM

## 2017-02-10 DIAGNOSIS — G4730 Sleep apnea, unspecified: ICD-10-CM

## 2017-02-10 DIAGNOSIS — M810 Age-related osteoporosis without current pathological fracture: ICD-10-CM

## 2017-02-10 DIAGNOSIS — E8881 Metabolic syndrome: ICD-10-CM

## 2017-02-10 DIAGNOSIS — K759 Inflammatory liver disease, unspecified: ICD-10-CM

## 2017-02-10 DIAGNOSIS — N3010 Interstitial cystitis (chronic) without hematuria: ICD-10-CM

## 2017-02-15 ENCOUNTER — Encounter: Admit: 2017-02-15 | Discharge: 2017-02-15 | Payer: MEDICARE

## 2017-02-19 ENCOUNTER — Encounter: Admit: 2017-02-19 | Discharge: 2017-02-19 | Payer: MEDICARE

## 2017-02-23 ENCOUNTER — Encounter: Admit: 2017-02-23 | Discharge: 2017-02-23 | Payer: MEDICARE

## 2017-02-23 ENCOUNTER — Ambulatory Visit: Admit: 2017-02-23 | Discharge: 2017-02-23 | Payer: MEDICARE

## 2017-02-23 DIAGNOSIS — E039 Hypothyroidism, unspecified: ICD-10-CM

## 2017-02-23 DIAGNOSIS — E8881 Metabolic syndrome: ICD-10-CM

## 2017-02-23 DIAGNOSIS — G459 Transient cerebral ischemic attack, unspecified: ICD-10-CM

## 2017-02-23 DIAGNOSIS — M810 Age-related osteoporosis without current pathological fracture: ICD-10-CM

## 2017-02-23 DIAGNOSIS — I10 Essential (primary) hypertension: ICD-10-CM

## 2017-02-23 DIAGNOSIS — H547 Unspecified visual loss: ICD-10-CM

## 2017-02-23 DIAGNOSIS — N3010 Interstitial cystitis (chronic) without hematuria: ICD-10-CM

## 2017-02-23 DIAGNOSIS — E119 Type 2 diabetes mellitus without complications: Principal | ICD-10-CM

## 2017-02-23 DIAGNOSIS — D0512 Intraductal carcinoma in situ of left breast: Principal | ICD-10-CM

## 2017-02-23 DIAGNOSIS — M1990 Unspecified osteoarthritis, unspecified site: ICD-10-CM

## 2017-02-23 DIAGNOSIS — I341 Nonrheumatic mitral (valve) prolapse: ICD-10-CM

## 2017-02-23 DIAGNOSIS — I499 Cardiac arrhythmia, unspecified: ICD-10-CM

## 2017-02-23 DIAGNOSIS — G629 Polyneuropathy, unspecified: ICD-10-CM

## 2017-02-23 DIAGNOSIS — M797 Fibromyalgia: ICD-10-CM

## 2017-02-23 DIAGNOSIS — K929 Disease of digestive system, unspecified: ICD-10-CM

## 2017-02-23 DIAGNOSIS — G4730 Sleep apnea, unspecified: ICD-10-CM

## 2017-02-23 DIAGNOSIS — M549 Dorsalgia, unspecified: ICD-10-CM

## 2017-02-23 DIAGNOSIS — G43909 Migraine, unspecified, not intractable, without status migrainosus: ICD-10-CM

## 2017-02-23 DIAGNOSIS — H9190 Unspecified hearing loss, unspecified ear: ICD-10-CM

## 2017-02-23 DIAGNOSIS — K759 Inflammatory liver disease, unspecified: ICD-10-CM

## 2017-02-23 DIAGNOSIS — R42 Dizziness and giddiness: ICD-10-CM

## 2017-02-23 DIAGNOSIS — K760 Fatty (change of) liver, not elsewhere classified: ICD-10-CM

## 2017-02-24 ENCOUNTER — Ambulatory Visit: Admit: 2017-02-24 | Discharge: 2017-03-11 | Payer: MEDICARE

## 2017-03-04 ENCOUNTER — Encounter: Admit: 2017-03-04 | Discharge: 2017-03-04 | Payer: MEDICARE

## 2017-03-04 DIAGNOSIS — M1990 Unspecified osteoarthritis, unspecified site: ICD-10-CM

## 2017-03-04 DIAGNOSIS — Z780 Asymptomatic menopausal state: Secondary | ICD-10-CM

## 2017-03-04 DIAGNOSIS — G629 Polyneuropathy, unspecified: ICD-10-CM

## 2017-03-04 DIAGNOSIS — K759 Inflammatory liver disease, unspecified: ICD-10-CM

## 2017-03-04 DIAGNOSIS — K929 Disease of digestive system, unspecified: ICD-10-CM

## 2017-03-04 DIAGNOSIS — E119 Type 2 diabetes mellitus without complications: Principal | ICD-10-CM

## 2017-03-04 DIAGNOSIS — N3010 Interstitial cystitis (chronic) without hematuria: ICD-10-CM

## 2017-03-04 DIAGNOSIS — E039 Hypothyroidism, unspecified: ICD-10-CM

## 2017-03-04 DIAGNOSIS — I10 Essential (primary) hypertension: ICD-10-CM

## 2017-03-04 DIAGNOSIS — M549 Dorsalgia, unspecified: ICD-10-CM

## 2017-03-04 DIAGNOSIS — E8881 Metabolic syndrome: ICD-10-CM

## 2017-03-04 DIAGNOSIS — K760 Fatty (change of) liver, not elsewhere classified: ICD-10-CM

## 2017-03-04 DIAGNOSIS — G4730 Sleep apnea, unspecified: ICD-10-CM

## 2017-03-04 DIAGNOSIS — D0512 Intraductal carcinoma in situ of left breast: Principal | ICD-10-CM

## 2017-03-04 DIAGNOSIS — I341 Nonrheumatic mitral (valve) prolapse: ICD-10-CM

## 2017-03-04 DIAGNOSIS — G459 Transient cerebral ischemic attack, unspecified: ICD-10-CM

## 2017-03-04 DIAGNOSIS — H9190 Unspecified hearing loss, unspecified ear: ICD-10-CM

## 2017-03-04 DIAGNOSIS — I499 Cardiac arrhythmia, unspecified: ICD-10-CM

## 2017-03-04 DIAGNOSIS — G43909 Migraine, unspecified, not intractable, without status migrainosus: ICD-10-CM

## 2017-03-04 DIAGNOSIS — M810 Age-related osteoporosis without current pathological fracture: ICD-10-CM

## 2017-03-04 DIAGNOSIS — H547 Unspecified visual loss: ICD-10-CM

## 2017-03-04 DIAGNOSIS — M797 Fibromyalgia: ICD-10-CM

## 2017-03-04 DIAGNOSIS — R42 Dizziness and giddiness: ICD-10-CM

## 2017-03-10 ENCOUNTER — Encounter: Admit: 2017-03-10 | Discharge: 2017-03-10 | Payer: MEDICARE

## 2017-03-11 DIAGNOSIS — C50812 Malignant neoplasm of overlapping sites of left female breast: Principal | ICD-10-CM

## 2017-03-12 ENCOUNTER — Ambulatory Visit: Admit: 2017-03-12 | Discharge: 2017-03-26 | Payer: MEDICARE

## 2017-03-18 ENCOUNTER — Encounter: Admit: 2017-03-18 | Discharge: 2017-03-18 | Payer: MEDICARE

## 2017-03-19 ENCOUNTER — Encounter: Admit: 2017-03-19 | Discharge: 2017-03-19 | Payer: MEDICARE

## 2017-03-22 ENCOUNTER — Encounter: Admit: 2017-03-22 | Discharge: 2017-03-22 | Payer: MEDICARE

## 2017-03-22 DIAGNOSIS — I341 Nonrheumatic mitral (valve) prolapse: ICD-10-CM

## 2017-03-22 DIAGNOSIS — G43909 Migraine, unspecified, not intractable, without status migrainosus: ICD-10-CM

## 2017-03-22 DIAGNOSIS — E039 Hypothyroidism, unspecified: ICD-10-CM

## 2017-03-22 DIAGNOSIS — K929 Disease of digestive system, unspecified: ICD-10-CM

## 2017-03-22 DIAGNOSIS — G629 Polyneuropathy, unspecified: ICD-10-CM

## 2017-03-22 DIAGNOSIS — K760 Fatty (change of) liver, not elsewhere classified: ICD-10-CM

## 2017-03-22 DIAGNOSIS — G4730 Sleep apnea, unspecified: ICD-10-CM

## 2017-03-22 DIAGNOSIS — R42 Dizziness and giddiness: ICD-10-CM

## 2017-03-22 DIAGNOSIS — G459 Transient cerebral ischemic attack, unspecified: ICD-10-CM

## 2017-03-22 DIAGNOSIS — H547 Unspecified visual loss: ICD-10-CM

## 2017-03-22 DIAGNOSIS — E8881 Metabolic syndrome: ICD-10-CM

## 2017-03-22 DIAGNOSIS — E119 Type 2 diabetes mellitus without complications: Principal | ICD-10-CM

## 2017-03-22 DIAGNOSIS — I499 Cardiac arrhythmia, unspecified: ICD-10-CM

## 2017-03-22 DIAGNOSIS — I10 Essential (primary) hypertension: ICD-10-CM

## 2017-03-22 DIAGNOSIS — M810 Age-related osteoporosis without current pathological fracture: ICD-10-CM

## 2017-03-22 DIAGNOSIS — M797 Fibromyalgia: ICD-10-CM

## 2017-03-22 DIAGNOSIS — H9190 Unspecified hearing loss, unspecified ear: ICD-10-CM

## 2017-03-22 DIAGNOSIS — K759 Inflammatory liver disease, unspecified: ICD-10-CM

## 2017-03-22 DIAGNOSIS — M549 Dorsalgia, unspecified: ICD-10-CM

## 2017-03-22 DIAGNOSIS — N3010 Interstitial cystitis (chronic) without hematuria: ICD-10-CM

## 2017-03-22 DIAGNOSIS — M1990 Unspecified osteoarthritis, unspecified site: ICD-10-CM

## 2017-03-23 ENCOUNTER — Encounter: Admit: 2017-03-23 | Discharge: 2017-03-23 | Payer: MEDICARE

## 2017-03-24 ENCOUNTER — Encounter: Admit: 2017-03-24 | Discharge: 2017-03-24 | Payer: MEDICARE

## 2017-03-25 ENCOUNTER — Encounter: Admit: 2017-03-25 | Discharge: 2017-03-25 | Payer: MEDICARE

## 2017-03-26 DIAGNOSIS — C50812 Malignant neoplasm of overlapping sites of left female breast: Principal | ICD-10-CM

## 2017-03-27 ENCOUNTER — Ambulatory Visit: Admit: 2017-03-27 | Discharge: 2017-04-08 | Payer: MEDICARE

## 2017-03-29 ENCOUNTER — Encounter: Admit: 2017-03-29 | Discharge: 2017-03-29 | Payer: MEDICARE

## 2017-03-29 DIAGNOSIS — E8881 Metabolic syndrome: ICD-10-CM

## 2017-03-29 DIAGNOSIS — K929 Disease of digestive system, unspecified: ICD-10-CM

## 2017-03-29 DIAGNOSIS — E039 Hypothyroidism, unspecified: ICD-10-CM

## 2017-03-29 DIAGNOSIS — G4730 Sleep apnea, unspecified: ICD-10-CM

## 2017-03-29 DIAGNOSIS — G43909 Migraine, unspecified, not intractable, without status migrainosus: ICD-10-CM

## 2017-03-29 DIAGNOSIS — N3010 Interstitial cystitis (chronic) without hematuria: ICD-10-CM

## 2017-03-29 DIAGNOSIS — K759 Inflammatory liver disease, unspecified: ICD-10-CM

## 2017-03-29 DIAGNOSIS — I499 Cardiac arrhythmia, unspecified: ICD-10-CM

## 2017-03-29 DIAGNOSIS — H9190 Unspecified hearing loss, unspecified ear: ICD-10-CM

## 2017-03-29 DIAGNOSIS — M1990 Unspecified osteoarthritis, unspecified site: ICD-10-CM

## 2017-03-29 DIAGNOSIS — E119 Type 2 diabetes mellitus without complications: Principal | ICD-10-CM

## 2017-03-29 DIAGNOSIS — G629 Polyneuropathy, unspecified: ICD-10-CM

## 2017-03-29 DIAGNOSIS — M549 Dorsalgia, unspecified: ICD-10-CM

## 2017-03-29 DIAGNOSIS — M797 Fibromyalgia: ICD-10-CM

## 2017-03-29 DIAGNOSIS — M810 Age-related osteoporosis without current pathological fracture: ICD-10-CM

## 2017-03-29 DIAGNOSIS — G459 Transient cerebral ischemic attack, unspecified: ICD-10-CM

## 2017-03-29 DIAGNOSIS — R42 Dizziness and giddiness: ICD-10-CM

## 2017-03-29 DIAGNOSIS — I341 Nonrheumatic mitral (valve) prolapse: ICD-10-CM

## 2017-03-29 DIAGNOSIS — K760 Fatty (change of) liver, not elsewhere classified: ICD-10-CM

## 2017-03-29 DIAGNOSIS — I10 Essential (primary) hypertension: ICD-10-CM

## 2017-03-29 DIAGNOSIS — H547 Unspecified visual loss: ICD-10-CM

## 2017-03-30 ENCOUNTER — Encounter: Admit: 2017-03-30 | Discharge: 2017-03-30 | Payer: MEDICARE

## 2017-04-01 ENCOUNTER — Encounter: Admit: 2017-04-01 | Discharge: 2017-04-01 | Payer: MEDICARE

## 2017-04-02 ENCOUNTER — Encounter: Admit: 2017-04-02 | Discharge: 2017-04-02 | Payer: MEDICARE

## 2017-04-05 ENCOUNTER — Encounter: Admit: 2017-04-05 | Discharge: 2017-04-05 | Payer: MEDICARE

## 2017-04-05 DIAGNOSIS — K759 Inflammatory liver disease, unspecified: ICD-10-CM

## 2017-04-05 DIAGNOSIS — I10 Essential (primary) hypertension: ICD-10-CM

## 2017-04-05 DIAGNOSIS — R42 Dizziness and giddiness: ICD-10-CM

## 2017-04-05 DIAGNOSIS — I341 Nonrheumatic mitral (valve) prolapse: ICD-10-CM

## 2017-04-05 DIAGNOSIS — K929 Disease of digestive system, unspecified: ICD-10-CM

## 2017-04-05 DIAGNOSIS — M797 Fibromyalgia: ICD-10-CM

## 2017-04-05 DIAGNOSIS — H547 Unspecified visual loss: ICD-10-CM

## 2017-04-05 DIAGNOSIS — N3010 Interstitial cystitis (chronic) without hematuria: ICD-10-CM

## 2017-04-05 DIAGNOSIS — E039 Hypothyroidism, unspecified: ICD-10-CM

## 2017-04-05 DIAGNOSIS — G43909 Migraine, unspecified, not intractable, without status migrainosus: ICD-10-CM

## 2017-04-05 DIAGNOSIS — I499 Cardiac arrhythmia, unspecified: ICD-10-CM

## 2017-04-05 DIAGNOSIS — G4730 Sleep apnea, unspecified: ICD-10-CM

## 2017-04-05 DIAGNOSIS — G459 Transient cerebral ischemic attack, unspecified: ICD-10-CM

## 2017-04-05 DIAGNOSIS — E8881 Metabolic syndrome: ICD-10-CM

## 2017-04-05 DIAGNOSIS — M1990 Unspecified osteoarthritis, unspecified site: ICD-10-CM

## 2017-04-05 DIAGNOSIS — M810 Age-related osteoporosis without current pathological fracture: ICD-10-CM

## 2017-04-05 DIAGNOSIS — M549 Dorsalgia, unspecified: ICD-10-CM

## 2017-04-05 DIAGNOSIS — K760 Fatty (change of) liver, not elsewhere classified: ICD-10-CM

## 2017-04-05 DIAGNOSIS — G629 Polyneuropathy, unspecified: ICD-10-CM

## 2017-04-05 DIAGNOSIS — E119 Type 2 diabetes mellitus without complications: Principal | ICD-10-CM

## 2017-04-05 DIAGNOSIS — H9190 Unspecified hearing loss, unspecified ear: ICD-10-CM

## 2017-04-06 ENCOUNTER — Encounter: Admit: 2017-04-06 | Discharge: 2017-04-06 | Payer: MEDICARE

## 2017-04-08 DIAGNOSIS — C50812 Malignant neoplasm of overlapping sites of left female breast: Principal | ICD-10-CM

## 2017-04-09 ENCOUNTER — Ambulatory Visit: Admit: 2017-04-09 | Discharge: 2017-04-23 | Payer: MEDICARE

## 2017-04-09 ENCOUNTER — Encounter: Admit: 2017-04-09 | Discharge: 2017-04-09 | Payer: MEDICARE

## 2017-04-12 ENCOUNTER — Encounter: Admit: 2017-04-12 | Discharge: 2017-04-12 | Payer: MEDICARE

## 2017-04-13 ENCOUNTER — Encounter: Admit: 2017-04-13 | Discharge: 2017-04-13 | Payer: MEDICARE

## 2017-04-14 ENCOUNTER — Encounter: Admit: 2017-04-14 | Discharge: 2017-04-14 | Payer: MEDICARE

## 2017-04-15 ENCOUNTER — Encounter: Admit: 2017-04-15 | Discharge: 2017-04-15 | Payer: MEDICARE

## 2017-04-16 ENCOUNTER — Encounter: Admit: 2017-04-16 | Discharge: 2017-04-16 | Payer: MEDICARE

## 2017-04-19 ENCOUNTER — Encounter: Admit: 2017-04-19 | Discharge: 2017-04-19 | Payer: MEDICARE

## 2017-04-20 ENCOUNTER — Encounter: Admit: 2017-04-20 | Discharge: 2017-04-20 | Payer: MEDICARE

## 2017-04-21 ENCOUNTER — Encounter: Admit: 2017-04-21 | Discharge: 2017-04-21 | Payer: MEDICARE

## 2017-04-22 ENCOUNTER — Encounter: Admit: 2017-04-22 | Discharge: 2017-04-22 | Payer: MEDICARE

## 2017-04-23 ENCOUNTER — Encounter: Admit: 2017-04-23 | Discharge: 2017-04-23 | Payer: MEDICARE

## 2017-04-23 DIAGNOSIS — C50812 Malignant neoplasm of overlapping sites of left female breast: Principal | ICD-10-CM

## 2017-05-10 ENCOUNTER — Ambulatory Visit: Admit: 2017-05-10 | Discharge: 2017-05-24 | Payer: MEDICARE

## 2017-05-10 DIAGNOSIS — D0512 Intraductal carcinoma in situ of left breast: Secondary | ICD-10-CM

## 2017-05-13 ENCOUNTER — Encounter: Admit: 2017-05-13 | Discharge: 2017-05-13 | Payer: MEDICARE

## 2017-05-13 DIAGNOSIS — R42 Dizziness and giddiness: ICD-10-CM

## 2017-05-13 DIAGNOSIS — E119 Type 2 diabetes mellitus without complications: Principal | ICD-10-CM

## 2017-05-13 DIAGNOSIS — I10 Essential (primary) hypertension: ICD-10-CM

## 2017-05-13 DIAGNOSIS — G4730 Sleep apnea, unspecified: ICD-10-CM

## 2017-05-13 DIAGNOSIS — E8881 Metabolic syndrome: ICD-10-CM

## 2017-05-13 DIAGNOSIS — K929 Disease of digestive system, unspecified: ICD-10-CM

## 2017-05-13 DIAGNOSIS — K760 Fatty (change of) liver, not elsewhere classified: ICD-10-CM

## 2017-05-13 DIAGNOSIS — H547 Unspecified visual loss: ICD-10-CM

## 2017-05-13 DIAGNOSIS — G629 Polyneuropathy, unspecified: ICD-10-CM

## 2017-05-13 DIAGNOSIS — E039 Hypothyroidism, unspecified: ICD-10-CM

## 2017-05-13 DIAGNOSIS — M1990 Unspecified osteoarthritis, unspecified site: ICD-10-CM

## 2017-05-13 DIAGNOSIS — M810 Age-related osteoporosis without current pathological fracture: ICD-10-CM

## 2017-05-13 DIAGNOSIS — N3010 Interstitial cystitis (chronic) without hematuria: ICD-10-CM

## 2017-05-13 DIAGNOSIS — K759 Inflammatory liver disease, unspecified: ICD-10-CM

## 2017-05-13 DIAGNOSIS — M797 Fibromyalgia: ICD-10-CM

## 2017-05-13 DIAGNOSIS — G459 Transient cerebral ischemic attack, unspecified: ICD-10-CM

## 2017-05-13 DIAGNOSIS — I341 Nonrheumatic mitral (valve) prolapse: ICD-10-CM

## 2017-05-13 DIAGNOSIS — G43909 Migraine, unspecified, not intractable, without status migrainosus: ICD-10-CM

## 2017-05-13 DIAGNOSIS — H9190 Unspecified hearing loss, unspecified ear: ICD-10-CM

## 2017-05-13 DIAGNOSIS — I499 Cardiac arrhythmia, unspecified: ICD-10-CM

## 2017-05-13 DIAGNOSIS — M549 Dorsalgia, unspecified: ICD-10-CM

## 2017-05-21 ENCOUNTER — Ambulatory Visit: Admit: 2017-05-21 | Discharge: 2017-05-21 | Payer: MEDICARE

## 2017-05-21 ENCOUNTER — Encounter: Admit: 2017-05-21 | Discharge: 2017-05-21 | Payer: MEDICARE

## 2017-05-21 DIAGNOSIS — N3010 Interstitial cystitis (chronic) without hematuria: ICD-10-CM

## 2017-05-21 DIAGNOSIS — G4730 Sleep apnea, unspecified: ICD-10-CM

## 2017-05-21 DIAGNOSIS — I10 Essential (primary) hypertension: ICD-10-CM

## 2017-05-21 DIAGNOSIS — M1990 Unspecified osteoarthritis, unspecified site: ICD-10-CM

## 2017-05-21 DIAGNOSIS — E039 Hypothyroidism, unspecified: ICD-10-CM

## 2017-05-21 DIAGNOSIS — K929 Disease of digestive system, unspecified: ICD-10-CM

## 2017-05-21 DIAGNOSIS — M810 Age-related osteoporosis without current pathological fracture: ICD-10-CM

## 2017-05-21 DIAGNOSIS — G629 Polyneuropathy, unspecified: ICD-10-CM

## 2017-05-21 DIAGNOSIS — E119 Type 2 diabetes mellitus without complications: Principal | ICD-10-CM

## 2017-05-21 DIAGNOSIS — K759 Inflammatory liver disease, unspecified: ICD-10-CM

## 2017-05-21 DIAGNOSIS — R42 Dizziness and giddiness: ICD-10-CM

## 2017-05-21 DIAGNOSIS — I499 Cardiac arrhythmia, unspecified: ICD-10-CM

## 2017-05-21 DIAGNOSIS — I341 Nonrheumatic mitral (valve) prolapse: ICD-10-CM

## 2017-05-21 DIAGNOSIS — H547 Unspecified visual loss: ICD-10-CM

## 2017-05-21 DIAGNOSIS — E8881 Metabolic syndrome: ICD-10-CM

## 2017-05-21 DIAGNOSIS — M797 Fibromyalgia: ICD-10-CM

## 2017-05-21 DIAGNOSIS — K760 Fatty (change of) liver, not elsewhere classified: ICD-10-CM

## 2017-05-21 DIAGNOSIS — M549 Dorsalgia, unspecified: ICD-10-CM

## 2017-05-21 DIAGNOSIS — G459 Transient cerebral ischemic attack, unspecified: ICD-10-CM

## 2017-05-21 DIAGNOSIS — H9190 Unspecified hearing loss, unspecified ear: ICD-10-CM

## 2017-05-21 DIAGNOSIS — G43909 Migraine, unspecified, not intractable, without status migrainosus: ICD-10-CM

## 2017-05-24 DIAGNOSIS — D0512 Intraductal carcinoma in situ of left breast: Secondary | ICD-10-CM

## 2017-05-24 DIAGNOSIS — Z08 Encounter for follow-up examination after completed treatment for malignant neoplasm: Principal | ICD-10-CM

## 2017-06-01 ENCOUNTER — Encounter: Admit: 2017-06-01 | Discharge: 2017-06-01 | Payer: MEDICARE

## 2017-06-01 DIAGNOSIS — G4730 Sleep apnea, unspecified: ICD-10-CM

## 2017-06-01 DIAGNOSIS — G459 Transient cerebral ischemic attack, unspecified: ICD-10-CM

## 2017-06-01 DIAGNOSIS — K760 Fatty (change of) liver, not elsewhere classified: ICD-10-CM

## 2017-06-01 DIAGNOSIS — E8881 Metabolic syndrome: ICD-10-CM

## 2017-06-01 DIAGNOSIS — I10 Essential (primary) hypertension: ICD-10-CM

## 2017-06-01 DIAGNOSIS — N3010 Interstitial cystitis (chronic) without hematuria: ICD-10-CM

## 2017-06-01 DIAGNOSIS — Z79810 Long term (current) use of selective estrogen receptor modulators (SERMs): ICD-10-CM

## 2017-06-01 DIAGNOSIS — E119 Type 2 diabetes mellitus without complications: Principal | ICD-10-CM

## 2017-06-01 DIAGNOSIS — M810 Age-related osteoporosis without current pathological fracture: ICD-10-CM

## 2017-06-01 DIAGNOSIS — I341 Nonrheumatic mitral (valve) prolapse: ICD-10-CM

## 2017-06-01 DIAGNOSIS — R42 Dizziness and giddiness: ICD-10-CM

## 2017-06-01 DIAGNOSIS — K759 Inflammatory liver disease, unspecified: ICD-10-CM

## 2017-06-01 DIAGNOSIS — H547 Unspecified visual loss: ICD-10-CM

## 2017-06-01 DIAGNOSIS — M797 Fibromyalgia: ICD-10-CM

## 2017-06-01 DIAGNOSIS — G629 Polyneuropathy, unspecified: ICD-10-CM

## 2017-06-01 DIAGNOSIS — K929 Disease of digestive system, unspecified: ICD-10-CM

## 2017-06-01 DIAGNOSIS — M1990 Unspecified osteoarthritis, unspecified site: ICD-10-CM

## 2017-06-01 DIAGNOSIS — D0512 Intraductal carcinoma in situ of left breast: Principal | ICD-10-CM

## 2017-06-01 DIAGNOSIS — I499 Cardiac arrhythmia, unspecified: ICD-10-CM

## 2017-06-01 DIAGNOSIS — H9190 Unspecified hearing loss, unspecified ear: ICD-10-CM

## 2017-06-01 DIAGNOSIS — G43909 Migraine, unspecified, not intractable, without status migrainosus: ICD-10-CM

## 2017-06-01 DIAGNOSIS — E039 Hypothyroidism, unspecified: ICD-10-CM

## 2017-06-01 DIAGNOSIS — Z170 Estrogen receptor positive status [ER+]: ICD-10-CM

## 2017-06-01 DIAGNOSIS — M549 Dorsalgia, unspecified: ICD-10-CM

## 2017-06-01 MED ORDER — TAMOXIFEN 20 MG PO TAB
20 mg | ORAL_TABLET | Freq: Every day | ORAL | 3 refills | Status: AC
Start: 2017-06-01 — End: 2017-11-12

## 2017-06-15 ENCOUNTER — Encounter: Admit: 2017-06-15 | Discharge: 2017-06-15 | Payer: MEDICARE

## 2017-06-17 ENCOUNTER — Encounter: Admit: 2017-06-17 | Discharge: 2017-06-17 | Payer: MEDICARE

## 2017-06-18 ENCOUNTER — Encounter: Admit: 2017-06-18 | Discharge: 2017-06-18 | Payer: MEDICARE

## 2017-06-28 ENCOUNTER — Encounter: Admit: 2017-06-28 | Discharge: 2017-06-28 | Payer: MEDICARE

## 2017-07-06 ENCOUNTER — Encounter: Admit: 2017-07-06 | Discharge: 2017-07-06 | Payer: MEDICARE

## 2017-07-06 DIAGNOSIS — M797 Fibromyalgia: ICD-10-CM

## 2017-07-06 DIAGNOSIS — H9190 Unspecified hearing loss, unspecified ear: ICD-10-CM

## 2017-07-06 DIAGNOSIS — D0512 Intraductal carcinoma in situ of left breast: Principal | ICD-10-CM

## 2017-07-06 DIAGNOSIS — I341 Nonrheumatic mitral (valve) prolapse: ICD-10-CM

## 2017-07-06 DIAGNOSIS — G459 Transient cerebral ischemic attack, unspecified: ICD-10-CM

## 2017-07-06 DIAGNOSIS — E8881 Metabolic syndrome: ICD-10-CM

## 2017-07-06 DIAGNOSIS — H547 Unspecified visual loss: ICD-10-CM

## 2017-07-06 DIAGNOSIS — M810 Age-related osteoporosis without current pathological fracture: ICD-10-CM

## 2017-07-06 DIAGNOSIS — K929 Disease of digestive system, unspecified: ICD-10-CM

## 2017-07-06 DIAGNOSIS — K759 Inflammatory liver disease, unspecified: ICD-10-CM

## 2017-07-06 DIAGNOSIS — M1990 Unspecified osteoarthritis, unspecified site: ICD-10-CM

## 2017-07-06 DIAGNOSIS — G629 Polyneuropathy, unspecified: ICD-10-CM

## 2017-07-06 DIAGNOSIS — R42 Dizziness and giddiness: ICD-10-CM

## 2017-07-06 DIAGNOSIS — K760 Fatty (change of) liver, not elsewhere classified: ICD-10-CM

## 2017-07-06 DIAGNOSIS — I499 Cardiac arrhythmia, unspecified: ICD-10-CM

## 2017-07-06 DIAGNOSIS — G4730 Sleep apnea, unspecified: ICD-10-CM

## 2017-07-06 DIAGNOSIS — M549 Dorsalgia, unspecified: ICD-10-CM

## 2017-07-06 DIAGNOSIS — E119 Type 2 diabetes mellitus without complications: Principal | ICD-10-CM

## 2017-07-06 DIAGNOSIS — E039 Hypothyroidism, unspecified: ICD-10-CM

## 2017-07-06 DIAGNOSIS — I10 Essential (primary) hypertension: ICD-10-CM

## 2017-07-06 DIAGNOSIS — N3010 Interstitial cystitis (chronic) without hematuria: ICD-10-CM

## 2017-07-06 DIAGNOSIS — Z170 Estrogen receptor positive status [ER+]: ICD-10-CM

## 2017-07-06 DIAGNOSIS — G43909 Migraine, unspecified, not intractable, without status migrainosus: ICD-10-CM

## 2017-08-02 ENCOUNTER — Encounter: Admit: 2017-08-02 | Discharge: 2017-08-02 | Payer: MEDICARE

## 2017-08-02 DIAGNOSIS — E119 Type 2 diabetes mellitus without complications: Principal | ICD-10-CM

## 2017-08-02 DIAGNOSIS — M797 Fibromyalgia: ICD-10-CM

## 2017-08-02 DIAGNOSIS — E039 Hypothyroidism, unspecified: ICD-10-CM

## 2017-08-02 DIAGNOSIS — E8881 Metabolic syndrome: ICD-10-CM

## 2017-08-02 DIAGNOSIS — K759 Inflammatory liver disease, unspecified: ICD-10-CM

## 2017-08-02 DIAGNOSIS — R252 Cramp and spasm: ICD-10-CM

## 2017-08-02 DIAGNOSIS — K929 Disease of digestive system, unspecified: ICD-10-CM

## 2017-08-02 DIAGNOSIS — I10 Essential (primary) hypertension: ICD-10-CM

## 2017-08-02 DIAGNOSIS — G629 Polyneuropathy, unspecified: ICD-10-CM

## 2017-08-02 DIAGNOSIS — G4730 Sleep apnea, unspecified: ICD-10-CM

## 2017-08-02 DIAGNOSIS — N3010 Interstitial cystitis (chronic) without hematuria: ICD-10-CM

## 2017-08-02 DIAGNOSIS — H547 Unspecified visual loss: ICD-10-CM

## 2017-08-02 DIAGNOSIS — R42 Dizziness and giddiness: ICD-10-CM

## 2017-08-02 DIAGNOSIS — I341 Nonrheumatic mitral (valve) prolapse: ICD-10-CM

## 2017-08-02 DIAGNOSIS — G43909 Migraine, unspecified, not intractable, without status migrainosus: ICD-10-CM

## 2017-08-02 DIAGNOSIS — M1990 Unspecified osteoarthritis, unspecified site: ICD-10-CM

## 2017-08-02 DIAGNOSIS — H9190 Unspecified hearing loss, unspecified ear: ICD-10-CM

## 2017-08-02 DIAGNOSIS — D0512 Intraductal carcinoma in situ of left breast: Principal | ICD-10-CM

## 2017-08-02 DIAGNOSIS — M549 Dorsalgia, unspecified: ICD-10-CM

## 2017-08-02 DIAGNOSIS — I499 Cardiac arrhythmia, unspecified: ICD-10-CM

## 2017-08-02 DIAGNOSIS — M810 Age-related osteoporosis without current pathological fracture: ICD-10-CM

## 2017-08-02 DIAGNOSIS — R232 Flushing: ICD-10-CM

## 2017-08-02 DIAGNOSIS — G459 Transient cerebral ischemic attack, unspecified: ICD-10-CM

## 2017-08-02 DIAGNOSIS — T451X5A Adverse effect of antineoplastic and immunosuppressive drugs, initial encounter: ICD-10-CM

## 2017-08-02 DIAGNOSIS — K760 Fatty (change of) liver, not elsewhere classified: ICD-10-CM

## 2017-11-10 ENCOUNTER — Encounter: Admit: 2017-11-10 | Discharge: 2017-11-10 | Payer: MEDICARE

## 2017-11-10 ENCOUNTER — Ambulatory Visit: Admit: 2017-11-10 | Discharge: 2017-11-11 | Payer: MEDICARE

## 2017-11-10 DIAGNOSIS — E119 Type 2 diabetes mellitus without complications: Principal | ICD-10-CM

## 2017-11-10 DIAGNOSIS — E8881 Metabolic syndrome: ICD-10-CM

## 2017-11-10 DIAGNOSIS — D0512 Intraductal carcinoma in situ of left breast: Principal | ICD-10-CM

## 2017-11-10 DIAGNOSIS — M1990 Unspecified osteoarthritis, unspecified site: ICD-10-CM

## 2017-11-10 DIAGNOSIS — Z170 Estrogen receptor positive status [ER+]: ICD-10-CM

## 2017-11-10 DIAGNOSIS — Z923 Personal history of irradiation: ICD-10-CM

## 2017-11-10 DIAGNOSIS — M810 Age-related osteoporosis without current pathological fracture: ICD-10-CM

## 2017-11-10 DIAGNOSIS — G4730 Sleep apnea, unspecified: ICD-10-CM

## 2017-11-10 DIAGNOSIS — R42 Dizziness and giddiness: ICD-10-CM

## 2017-11-10 DIAGNOSIS — M797 Fibromyalgia: ICD-10-CM

## 2017-11-10 DIAGNOSIS — E039 Hypothyroidism, unspecified: ICD-10-CM

## 2017-11-10 DIAGNOSIS — G629 Polyneuropathy, unspecified: ICD-10-CM

## 2017-11-10 DIAGNOSIS — M549 Dorsalgia, unspecified: ICD-10-CM

## 2017-11-10 DIAGNOSIS — G459 Transient cerebral ischemic attack, unspecified: ICD-10-CM

## 2017-11-10 DIAGNOSIS — H9190 Unspecified hearing loss, unspecified ear: ICD-10-CM

## 2017-11-10 DIAGNOSIS — K929 Disease of digestive system, unspecified: ICD-10-CM

## 2017-11-10 DIAGNOSIS — Z9012 Acquired absence of left breast and nipple: ICD-10-CM

## 2017-11-10 DIAGNOSIS — I341 Nonrheumatic mitral (valve) prolapse: ICD-10-CM

## 2017-11-10 DIAGNOSIS — K760 Fatty (change of) liver, not elsewhere classified: ICD-10-CM

## 2017-11-10 DIAGNOSIS — H547 Unspecified visual loss: ICD-10-CM

## 2017-11-10 DIAGNOSIS — N3010 Interstitial cystitis (chronic) without hematuria: ICD-10-CM

## 2017-11-10 DIAGNOSIS — K759 Inflammatory liver disease, unspecified: ICD-10-CM

## 2017-11-10 DIAGNOSIS — I10 Essential (primary) hypertension: ICD-10-CM

## 2017-11-10 DIAGNOSIS — G43909 Migraine, unspecified, not intractable, without status migrainosus: ICD-10-CM

## 2017-11-10 DIAGNOSIS — I499 Cardiac arrhythmia, unspecified: ICD-10-CM

## 2017-11-12 ENCOUNTER — Encounter: Admit: 2017-11-12 | Discharge: 2017-11-12 | Payer: MEDICARE

## 2017-11-12 DIAGNOSIS — Z780 Asymptomatic menopausal state: Principal | ICD-10-CM

## 2017-11-12 DIAGNOSIS — D0512 Intraductal carcinoma in situ of left breast: ICD-10-CM

## 2017-11-12 MED ORDER — TAMOXIFEN 20 MG PO TAB
20 mg | ORAL_TABLET | Freq: Every day | ORAL | 2 refills | Status: AC
Start: 2017-11-12 — End: 2017-11-15

## 2017-11-15 ENCOUNTER — Encounter: Admit: 2017-11-15 | Discharge: 2017-11-15 | Payer: MEDICARE

## 2017-11-15 DIAGNOSIS — M25551 Pain in right hip: ICD-10-CM

## 2017-11-15 DIAGNOSIS — M1990 Unspecified osteoarthritis, unspecified site: ICD-10-CM

## 2017-11-15 DIAGNOSIS — Z79810 Long term (current) use of selective estrogen receptor modulators (SERMs): ICD-10-CM

## 2017-11-15 DIAGNOSIS — I341 Nonrheumatic mitral (valve) prolapse: ICD-10-CM

## 2017-11-15 DIAGNOSIS — G4730 Sleep apnea, unspecified: ICD-10-CM

## 2017-11-15 DIAGNOSIS — N3010 Interstitial cystitis (chronic) without hematuria: ICD-10-CM

## 2017-11-15 DIAGNOSIS — G459 Transient cerebral ischemic attack, unspecified: ICD-10-CM

## 2017-11-15 DIAGNOSIS — D0512 Intraductal carcinoma in situ of left breast: Principal | ICD-10-CM

## 2017-11-15 DIAGNOSIS — G629 Polyneuropathy, unspecified: ICD-10-CM

## 2017-11-15 DIAGNOSIS — K759 Inflammatory liver disease, unspecified: ICD-10-CM

## 2017-11-15 DIAGNOSIS — Z170 Estrogen receptor positive status [ER+]: ICD-10-CM

## 2017-11-15 DIAGNOSIS — E039 Hypothyroidism, unspecified: ICD-10-CM

## 2017-11-15 DIAGNOSIS — G43909 Migraine, unspecified, not intractable, without status migrainosus: ICD-10-CM

## 2017-11-15 DIAGNOSIS — H547 Unspecified visual loss: ICD-10-CM

## 2017-11-15 DIAGNOSIS — I499 Cardiac arrhythmia, unspecified: ICD-10-CM

## 2017-11-15 DIAGNOSIS — Z5181 Encounter for therapeutic drug level monitoring: ICD-10-CM

## 2017-11-15 DIAGNOSIS — M797 Fibromyalgia: ICD-10-CM

## 2017-11-15 DIAGNOSIS — R252 Cramp and spasm: ICD-10-CM

## 2017-11-15 DIAGNOSIS — N951 Menopausal and female climacteric states: ICD-10-CM

## 2017-11-15 DIAGNOSIS — I10 Essential (primary) hypertension: ICD-10-CM

## 2017-11-15 DIAGNOSIS — G8929 Other chronic pain: ICD-10-CM

## 2017-11-15 DIAGNOSIS — H9190 Unspecified hearing loss, unspecified ear: ICD-10-CM

## 2017-11-15 DIAGNOSIS — M549 Dorsalgia, unspecified: ICD-10-CM

## 2017-11-15 DIAGNOSIS — K929 Disease of digestive system, unspecified: ICD-10-CM

## 2017-11-15 DIAGNOSIS — K760 Fatty (change of) liver, not elsewhere classified: ICD-10-CM

## 2017-11-15 DIAGNOSIS — E119 Type 2 diabetes mellitus without complications: Principal | ICD-10-CM

## 2017-11-15 DIAGNOSIS — Z923 Personal history of irradiation: ICD-10-CM

## 2017-11-15 DIAGNOSIS — E8881 Metabolic syndrome: ICD-10-CM

## 2017-11-15 DIAGNOSIS — M810 Age-related osteoporosis without current pathological fracture: ICD-10-CM

## 2017-11-15 DIAGNOSIS — R42 Dizziness and giddiness: ICD-10-CM

## 2017-11-15 LAB — LIVER FUNCTION PANEL
Lab: 0.2 mg/dL (ref <0.4)
Lab: 1.3 mg/dL — ABNORMAL HIGH (ref 0.3–1.2)
Lab: 16 U/L (ref 7–40)
Lab: 17 U/L (ref 7–56)
Lab: 4.1 g/dL (ref 3.5–5.0)
Lab: 6.6 g/dL (ref 6.0–8.0)
Lab: 60 U/L (ref 25–110)

## 2017-11-15 MED ORDER — TAMOXIFEN 20 MG PO TAB
20 mg | ORAL_TABLET | Freq: Every day | ORAL | 3 refills | Status: AC
Start: 2017-11-15 — End: 2018-07-19

## 2017-11-30 ENCOUNTER — Encounter: Admit: 2017-11-30 | Discharge: 2017-11-30 | Payer: MEDICARE

## 2017-12-02 ENCOUNTER — Encounter: Admit: 2017-12-02 | Discharge: 2017-12-02 | Payer: MEDICARE

## 2017-12-03 ENCOUNTER — Encounter: Admit: 2017-12-03 | Discharge: 2017-12-03 | Payer: MEDICARE

## 2017-12-03 DIAGNOSIS — D0512 Intraductal carcinoma in situ of left breast: Principal | ICD-10-CM

## 2017-12-03 DIAGNOSIS — M25551 Pain in right hip: ICD-10-CM

## 2017-12-06 ENCOUNTER — Encounter: Admit: 2017-12-06 | Discharge: 2017-12-06 | Payer: MEDICARE

## 2018-03-02 ENCOUNTER — Encounter: Admit: 2018-03-02 | Discharge: 2018-03-02 | Payer: MEDICARE

## 2018-05-05 ENCOUNTER — Encounter: Admit: 2018-05-05 | Discharge: 2018-05-05 | Payer: MEDICARE

## 2018-05-05 DIAGNOSIS — Z1231 Encounter for screening mammogram for malignant neoplasm of breast: Principal | ICD-10-CM

## 2018-05-05 NOTE — Telephone Encounter
LM advising that we would like to cancel her appointment on 05/24/2018.  Recommend calling back.     Will reschedule in 6 months when she is due for her screening mammogram.    Alease Medina, PA-C

## 2018-07-19 ENCOUNTER — Encounter: Admit: 2018-07-19 | Discharge: 2018-07-19

## 2018-07-19 DIAGNOSIS — E8881 Metabolic syndrome: Secondary | ICD-10-CM

## 2018-07-19 DIAGNOSIS — G459 Transient cerebral ischemic attack, unspecified: Secondary | ICD-10-CM

## 2018-07-19 DIAGNOSIS — I499 Cardiac arrhythmia, unspecified: Secondary | ICD-10-CM

## 2018-07-19 DIAGNOSIS — M797 Fibromyalgia: Secondary | ICD-10-CM

## 2018-07-19 DIAGNOSIS — K760 Fatty (change of) liver, not elsewhere classified: Secondary | ICD-10-CM

## 2018-07-19 DIAGNOSIS — D0512 Intraductal carcinoma in situ of left breast: Secondary | ICD-10-CM

## 2018-07-19 DIAGNOSIS — N3010 Interstitial cystitis (chronic) without hematuria: Secondary | ICD-10-CM

## 2018-07-19 DIAGNOSIS — Z170 Estrogen receptor positive status [ER+]: Secondary | ICD-10-CM

## 2018-07-19 DIAGNOSIS — E119 Type 2 diabetes mellitus without complications: Secondary | ICD-10-CM

## 2018-07-19 DIAGNOSIS — E039 Hypothyroidism, unspecified: Secondary | ICD-10-CM

## 2018-07-19 DIAGNOSIS — R51 Headache: Secondary | ICD-10-CM

## 2018-07-19 DIAGNOSIS — M810 Age-related osteoporosis without current pathological fracture: Secondary | ICD-10-CM

## 2018-07-19 DIAGNOSIS — G629 Polyneuropathy, unspecified: Secondary | ICD-10-CM

## 2018-07-19 DIAGNOSIS — G4700 Insomnia, unspecified: Secondary | ICD-10-CM

## 2018-07-19 DIAGNOSIS — Z79811 Long term (current) use of aromatase inhibitors: Secondary | ICD-10-CM

## 2018-07-19 DIAGNOSIS — R232 Flushing: Secondary | ICD-10-CM

## 2018-07-19 DIAGNOSIS — G43909 Migraine, unspecified, not intractable, without status migrainosus: Secondary | ICD-10-CM

## 2018-07-19 DIAGNOSIS — K929 Disease of digestive system, unspecified: Secondary | ICD-10-CM

## 2018-07-19 DIAGNOSIS — I10 Essential (primary) hypertension: Secondary | ICD-10-CM

## 2018-07-19 DIAGNOSIS — H547 Unspecified visual loss: Secondary | ICD-10-CM

## 2018-07-19 DIAGNOSIS — G4730 Sleep apnea, unspecified: Secondary | ICD-10-CM

## 2018-07-19 DIAGNOSIS — R42 Dizziness and giddiness: Secondary | ICD-10-CM

## 2018-07-19 DIAGNOSIS — H9190 Unspecified hearing loss, unspecified ear: Secondary | ICD-10-CM

## 2018-07-19 DIAGNOSIS — M549 Dorsalgia, unspecified: Secondary | ICD-10-CM

## 2018-07-19 DIAGNOSIS — I341 Nonrheumatic mitral (valve) prolapse: Secondary | ICD-10-CM

## 2018-07-19 DIAGNOSIS — K759 Inflammatory liver disease, unspecified: Secondary | ICD-10-CM

## 2018-07-19 DIAGNOSIS — M1990 Unspecified osteoarthritis, unspecified site: Secondary | ICD-10-CM

## 2018-07-19 LAB — CBC AND DIFF
Lab: 0.1 10*3/uL (ref 0–0.20)
Lab: 0.1 10*3/uL (ref 0–0.45)
Lab: 0.5 10*3/uL (ref 0–0.80)
Lab: 1 % (ref >60)
Lab: 1.6 10*3/uL (ref 1.0–4.8)
Lab: 13 % (ref 11–15)
Lab: 14 g/dL (ref 12.0–15.0)
Lab: 2 % (ref >60)
Lab: 25 % (ref 24–44)
Lab: 255 10*3/uL (ref 150–400)
Lab: 31 pg (ref 26–34)
Lab: 34 g/dL — ABNORMAL HIGH (ref 32.0–36.0)
Lab: 4.3 10*3/uL (ref 1.8–7.0)
Lab: 4.5 M/UL — ABNORMAL HIGH (ref 4.0–5.0)
Lab: 41 % (ref 36–45)
Lab: 6.6 10*3/uL (ref 4.5–11.0)
Lab: 64 % (ref 41–77)
Lab: 8 % — ABNORMAL HIGH (ref 4–12)
Lab: 8.4 FL (ref 7–11)
Lab: 91 FL (ref 80–100)

## 2018-07-19 LAB — COMPREHENSIVE METABOLIC PANEL
Lab: 139 MMOL/L (ref 137–147)
Lab: 3.2 MMOL/L — ABNORMAL LOW (ref 3.5–5.1)

## 2018-07-19 MED ORDER — LETROZOLE 2.5 MG PO TAB
2.5 mg | ORAL_TABLET | Freq: Every day | ORAL | 3 refills | 16.50000 days | Status: DC
Start: 2018-07-19 — End: 2018-10-24

## 2018-07-19 NOTE — Progress Notes
Name: Alexis Lewis          MRN: [id number]      DOB: 1950-05-23      AGE: 68 y.o.   DATE OF SERVICE: 07/19/2018    Subjective:             Reason for Visit:  Heme/Onc Care      Alexis Lewis is a 68 y.o. female.     Cancer Staging  Ductal carcinoma in situ (DCIS) of left breast  Staging form: Breast, AJCC 8th Edition  - Clinical stage from 12/30/2016: Stage 0 (cTis (DCIS), cN0, cM0, ER: Positive, PR: Positive, HER2: Not assessed ) - Signed by Alease Medina, PA-C on 12/30/2016  - Pathologic stage from 02/04/2017: Stage 0 (pTis (DCIS), pN0, cM0, ER: Positive, PR: Positive, HER2: Not assessed ) - Signed by Alease Medina, PA-C on 02/04/2017      History of Present Illness    Alexis Lewis is a 68 y.o. female is a patient of Dr. Willette Pa who is here for follow up for her DCIS of left breast, ER/PR positive.  She completed radiation 04/21/17.  She was on tamoxifen, but stopped this and 11/2017 due to insomnia and fatigue.  She continues to have issues with sleep.  She does report that her sleep improved mildly with stopping tamoxifen.  She has tried melatonin in the past which causes headaches.  Discussed trying Femara 2.5 mg p.o. daily.  She is willing to give this a try.  Was unaccompanied today.    Oncology History:    CHIEF COMPLAINT:???????????????  2.4 cm, pTis N0 M0???, Stage 0, ???Ductal Carcinoma in- Situ of the Left Breast.  Location: Left Breast, 6;00 Position, 4 cm From the Nipple.  Grade 2.  Patterrn: ???Solid and Solid Papillary.  Negative Margins.  ER+???(99%)  PR+???(96%)  Ki-67 index: ???18%  ???  PREVIOUS PROCEDURES / TREATMENTS:  12/10/16 ???Left Breast Core Biopsy [facility]).  01/28/17 ???Left Breast Lumpectomy ([facility]).  04/21/17 ???Completed Left Breast Hypofractionated Radiation to 5205 cGy.  07/10/17  Tamoxifen post Radiation per Dr. Willette Pa.    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  Past surgical history:  has a past surgical history that includes ovarian cyst removal (1990's); colonoscopy; tonsillectomy (1988); appendectomy (4782); thyroidectomy (2015); hysterectomy (1990's); and Mastectomy, partial (Left, 01/28/2017) (LEFT RADIOACTIVE SEED LOCALIZED LUMPECTOMY performed by Stanton Kidney, MD at [facility] OR/[facility]).  Family history:  family history includes Arthritis-rheumatoid in her father; Diabetes in her maternal grandfather, maternal grandmother, mother, and sister; Heart Disease in her father; High Cholesterol in her father and mother; Hypertension in her mother and sister; Migraines in her mother; Rashes/Skin Problems in her father and sister.  Social history:  reports that she has never smoked. She has never used smokeless tobacco. She reports that she does not drink alcohol or use drugs.       Review of Systems   Constitutional: Negative.    HENT: Negative.    Eyes: Negative.    Respiratory: Negative.    Cardiovascular: Negative.    Gastrointestinal: Negative.    Endocrine: Negative.  Genitourinary: Negative.    Musculoskeletal: Negative.    Allergic/Immunologic: Negative.    Neurological: Negative.    Hematological: Negative.    Psychiatric/Behavioral: Positive for sleep disturbance. Negative for agitation and behavioral problems.         Objective:         ??? cranberry fruit 400 mg tab Take  by mouth.   ??? Ginger (Zingiber officinalis) (GINGER EXTRACT) 250 mg cap Take  by mouth. ??? hydroCHLOROthiazide (HYDRODIURIL) 25 mg tablet Take 1 tablet by mouth.   ??? insulin aspart U-100 (NOVOLOG U-100 INSULIN ASPART) 100 unit/mL injection Inject 10 Units under the skin three times daily with meals.   ??? insulin glargine U-300 conc (TOUJEO SOLOSTAR) 300 unit/mL (1.5 mL) injectable Inject  under the skin daily.   ??? insulin lispro (HUMALOG KWIKPEN INSULIN) 100 unit/mL injection PEN Inject 30 Units under the skin three times daily with meals.   ??? letrozole (FEMARA) 2.5 mg tablet Take one tablet by mouth daily.   ??? levothyroxine (SYNTHROID) 125 mcg tablet Take 125 mcg by mouth daily 30 minutes before breakfast.   ??? loperamide (IMODIUM A-D) 2 mg capsule Take 2 mg by mouth as Needed for Diarrhea. Take 2 capsules by mouth initially, followed by 1 capsule by mouth after each loose stool up to a maximum of 8 tablets in 24 hours.   ??? losartan(+) (COZAAR) 100 mg tablet Take 100 mg by mouth daily.   ??? metoprolol XL (TOPROL XL) 50 mg extended release tablet Take 50 mg by mouth twice daily.   ??? turmeric 400 mg cap Take  by mouth.     Vitals:    07/19/18 1357   BP: (!) 158/80   BP Source: Arm, Right Upper   Patient Position: Sitting   Pulse: 101   Resp: 18   Temp: (!) 35.9 ???C (96.6 ???F)   TempSrc: Temporal   SpO2: 98%   Weight: 81.9 kg (180 lb 9.6 oz)   Height: 157.5 cm (62)   PainSc: Zero     Body mass index is 33.03 kg/m???.     Pain Score: Zero       Hospital Outpatient Visit on 07/19/2018   Component Date Value Ref Range Status   ??? Sodium 07/19/2018 139  137 - 147 MMOL/L Final   ??? Potassium 07/19/2018 3.2* 3.5 - 5.1 MMOL/L Final   ??? Chloride 07/19/2018 101  98 - 110 MMOL/L Final   ??? Glucose 07/19/2018 155* 70 - 100 MG/DL Final   ??? Blood Urea Nitrogen 07/19/2018 21  7 - 25 MG/DL Final   ??? Creatinine 07/19/2018 0.91  0.4 - 1.00 MG/DL Final   ??? Calcium 16/11/9602 9.4  8.5 - 10.6 MG/DL Final   ??? Total Protein 07/19/2018 7.0  6.0 - 8.0 G/DL Final   ??? Total Bilirubin 07/19/2018 1.5* 0.3 - 1.2 MG/DL Final ??? Albumin 54/10/8117 4.4  3.5 - 5.0 G/DL Final   ??? Alk Phosphatase 07/19/2018 80  25 - 110 U/L Final   ??? AST (SGOT) 07/19/2018 33  7 - 40 U/L Final   ??? CO2 07/19/2018 24  21 - 30 MMOL/L Final   ??? ALT (SGPT) 07/19/2018 30  7 - 56 U/L Final   ??? Anion Gap 07/19/2018 14* 3 - 12 Final   ??? eGFR Non African American 07/19/2018 >60  >60 mL/min Final    Comment: The eGFR is not validated for use in drug dosing adjustments.  Continue to use   estimated creatinine clearance  per dosing reference text.  Please contact the   Clinical Pharmacist for questions.     ??? eGFR African American 07/19/2018 >60  >60 mL/min Final    Comment: The eGFR is not validated for use in drug dosing adjustments.  Continue to use   estimated creatinine clearance per dosing reference text.  Please contact the   Clinical Pharmacist for questions.     ??? White Blood Cells 07/19/2018 6.6  4.5 - 11.0 K/UL Final   ??? RBC 07/19/2018 4.56  4.0 - 5.0 M/UL Final   ??? Hemoglobin 07/19/2018 14.3  12.0 - 15.0 GM/DL Final   ??? Hematocrit 07/19/2018 41.8  36 - 45 % Final   ??? MCV 07/19/2018 91.7  80 - 100 FL Final   ??? MCH 07/19/2018 31.3  26 - 34 PG Final   ??? MCHC 07/19/2018 34.1  32.0 - 36.0 G/DL Final   ??? RDW 47/82/9562 13.6  11 - 15 % Final   ??? Platelet Count 07/19/2018 255  150 - 400 K/UL Final   ??? MPV 07/19/2018 8.4  7 - 11 FL Final   ??? Neutrophils 07/19/2018 64  41 - 77 % Final   ??? Lymphocytes 07/19/2018 25  24 - 44 % Final   ??? Monocytes 07/19/2018 8  4 - 12 % Final   ??? Eosinophils 07/19/2018 2  0 - 5 % Final   ??? Basophils 07/19/2018 1  0 - 2 % Final   ??? Absolute Neutrophil Count 07/19/2018 4.30  1.8 - 7.0 K/UL Final   ??? Absolute Lymph Count 07/19/2018 1.60  1.0 - 4.8 K/UL Final   ??? Absolute Monocyte Count 07/19/2018 0.50  0 - 0.80 K/UL Final   ??? Absolute Eosinophil Count 07/19/2018 0.10  0 - 0.45 K/UL Final   ??? Absolute Basophil Count 07/19/2018 0.10  0 - 0.20 K/UL Final       Pain Addressed:  N/A Patient Evaluated for a Clinical Trial: No treatment clinical trial available for this patient.     [place] Cooperative Oncology Group performance status is 0, Fully active, able to carry on all pre-disease performance without restriction.Marland Kitchen     Physical Exam  Vitals signs reviewed.   Constitutional:       General: She is not in acute distress.     Appearance: She is well-developed.   HENT:      Head: Normocephalic and atraumatic.   Neck:      Musculoskeletal: Normal range of motion and neck supple.   Cardiovascular:      Rate and Rhythm: Normal rate.      Heart sounds: Normal heart sounds.   Pulmonary:      Effort: Pulmonary effort is normal. No respiratory distress.      Breath sounds: Normal breath sounds.   Chest:       Abdominal:      General: Bowel sounds are normal. There is no distension.      Palpations: Abdomen is soft.      Tenderness: There is no abdominal tenderness.   Musculoskeletal: Normal range of motion.   Skin:     General: Skin is warm and dry.   Neurological:      Mental Status: She is alert and oriented to person, place, and time.   Psychiatric:         Behavior: Behavior normal.               Assessment and Plan:    ICD-9-CM ICD-10-CM  1. Ductal carcinoma in situ (DCIS) of left breast 233.0 D05.12 CBC AND DIFF      COMPREHENSIVE METABOLIC PANEL   2. Hot flashes 782.62 R23.2    3. Insomnia, unspecified type 780.52 G47.00      ???  Ductal carcinoma in situ (DCIS) of left breast  DCIS left breast ER/PR positive.  Right lumpectomy and radiation.  Started Tamoxifen 07/10/17.  Tamoxifen 11/2017 due to insomnia.   Mammo was supposed to be repeated in March 2020, but this was delayed due to COVID.  Will reschedule that now.  Plan to start Femara 2.5 mg p.o. daily.  Follow-up with Dr. Willette Pa in 3 months.  ???  She had a normal DEXA scan on 04/22/2017.  Recommend calcium and vitamin D supplementation.    Hot flashes  Discussed that this can recur with Femara.  Will report any concerning side effects.  ??? Insomnia  Melatonin has given her headache.  Recommended she try Benadryl at at bedtime                The above assessment and plan was reviewed with the patient and she verbalizes understanding and questions were answered to their satisfaction. she has contact information for the clinic and knows to call with any worsening symptoms or any questions or concerns.

## 2018-07-21 ENCOUNTER — Encounter: Admit: 2018-07-21 | Discharge: 2018-07-21

## 2018-07-21 NOTE — Progress Notes
Patient's lab results faxed to primary care doctors office: Dr Shon Hough with message on cover sheet to note elevated bilirubin and decreased potassium and address issues with patient.  Patient notified by phone to address these abnormal labs with her primary care doctor, Dr Shon Hough. Patient verbalzed understanding and all questions answered at end of call.

## 2018-08-30 ENCOUNTER — Encounter: Admit: 2018-08-30 | Discharge: 2018-08-30

## 2018-08-30 DIAGNOSIS — M797 Fibromyalgia: Secondary | ICD-10-CM

## 2018-08-30 DIAGNOSIS — G4730 Sleep apnea, unspecified: Secondary | ICD-10-CM

## 2018-08-30 DIAGNOSIS — D0512 Intraductal carcinoma in situ of left breast: Secondary | ICD-10-CM

## 2018-08-30 DIAGNOSIS — G43909 Migraine, unspecified, not intractable, without status migrainosus: Secondary | ICD-10-CM

## 2018-08-30 DIAGNOSIS — M549 Dorsalgia, unspecified: Secondary | ICD-10-CM

## 2018-08-30 DIAGNOSIS — G629 Polyneuropathy, unspecified: Secondary | ICD-10-CM

## 2018-08-30 DIAGNOSIS — I499 Cardiac arrhythmia, unspecified: Secondary | ICD-10-CM

## 2018-08-30 DIAGNOSIS — Z1231 Encounter for screening mammogram for malignant neoplasm of breast: Secondary | ICD-10-CM

## 2018-08-30 DIAGNOSIS — E8881 Metabolic syndrome: Secondary | ICD-10-CM

## 2018-08-30 DIAGNOSIS — I341 Nonrheumatic mitral (valve) prolapse: Secondary | ICD-10-CM

## 2018-08-30 DIAGNOSIS — I10 Essential (primary) hypertension: Secondary | ICD-10-CM

## 2018-08-30 DIAGNOSIS — R42 Dizziness and giddiness: Secondary | ICD-10-CM

## 2018-08-30 DIAGNOSIS — G459 Transient cerebral ischemic attack, unspecified: Secondary | ICD-10-CM

## 2018-08-30 DIAGNOSIS — K929 Disease of digestive system, unspecified: Secondary | ICD-10-CM

## 2018-08-30 DIAGNOSIS — H9190 Unspecified hearing loss, unspecified ear: Secondary | ICD-10-CM

## 2018-08-30 DIAGNOSIS — E119 Type 2 diabetes mellitus without complications: Secondary | ICD-10-CM

## 2018-08-30 DIAGNOSIS — E039 Hypothyroidism, unspecified: Secondary | ICD-10-CM

## 2018-08-30 DIAGNOSIS — H547 Unspecified visual loss: Secondary | ICD-10-CM

## 2018-08-30 DIAGNOSIS — K759 Inflammatory liver disease, unspecified: Secondary | ICD-10-CM

## 2018-08-30 DIAGNOSIS — M810 Age-related osteoporosis without current pathological fracture: Secondary | ICD-10-CM

## 2018-08-30 DIAGNOSIS — K760 Fatty (change of) liver, not elsewhere classified: Secondary | ICD-10-CM

## 2018-08-30 DIAGNOSIS — N3010 Interstitial cystitis (chronic) without hematuria: Secondary | ICD-10-CM

## 2018-08-30 DIAGNOSIS — M1990 Unspecified osteoarthritis, unspecified site: Secondary | ICD-10-CM

## 2018-08-30 NOTE — Progress Notes
Name: Alexis Lewis          MRN: [id number]      DOB: January 25, 1951      AGE: 68 y.o.   DATE OF SERVICE: 08/30/2018              Reason for Visit:  Heme/Onc Care      TAVIANNA COURTEAU is a 68 y.o. female.     Cancer Staging  Ductal carcinoma in situ (DCIS) of left breast  Staging form: Breast, AJCC 8th Edition  - Clinical stage from 12/30/2016: Stage 0 (cTis (DCIS), cN0, cM0, ER: Positive, PR: Positive, HER2: Not assessed ) - Signed by Alease Medina, PA-C on 12/30/2016  - Pathologic stage from 02/04/2017: Stage 0 (pTis (DCIS), pN0, cM0, ER: Positive, PR: Positive, HER2: Not assessed ) - Signed by Alease Medina, PA-C on 02/04/2017      DIAGNOSIS:  Left grade 2 DCIS (ER 99%, PR 99%) dx 12/2016      History of Present Illness    Alexis Lewis presents to clinic today for routine 1.5 year follow up. She is doing well and has no breast or systemic concerns.      HISTORY:  Alexis Lewis is a female who presented to the [facility] on 12/30/2016 at age 89 for left breast cancer. She presented for screening mammogram 05/11/2016 and was told there was an abnormality and 6 month follow up with recommended.  She returned for diagnostic mammogram and ultrasound 12/04/2016 which confirmed mass and biopsy was recommended.  She underwent sono-guided biopsy on 12/10/2016 which revealed grade 2 DCIS.  She proceeded with Left rsl lumpectomy on 01/28/2017.  Final surgical pathology revealed grade 2 DCIS measuring 2.4 cm with clear margins. She completed adjuvant radiation therapy on 04/21/2017.  She started Tamoxifen 07/2017.     BREAST IMAGING:  Mammogram:    - Screening mammogram ([place]) revealed a moderate heterogenous fibroglandular parenchymal pattern.  No dominant mass lesion or architectural change.  Several benign calcifications were identified with no evidence of new suspicious aggregate pleomorphic microcalcifications.   - Left diagnostic mammogram 05/28/2016 [facility]) revealed compression magnification views reveal no definite persistent concerning density. As a precaution repeat left mammogram in 6 months was recommended.   - Left diagnostic mammogram 12/04/2016 ([place]) revealed a lobulated ovoid same mass in the left breast 4 cm FTN at the 6:00 position.  - Bilateral diagnostic mammogram 12/30/2016 ([place]) revealed both breast demonstrate scattered fibroglandular densities. The right breast appears normal. At 6:00 in the left breast approximately 4-5 cm from nipple there was a small hematoma and a clip marker from recent biopsy.  Ultrasound:   - Left breast ultrasound 12/04/2016 [facility]) revealed a hypoechoic irregular mass with mild vascularity on color doppler imaging at 6:00 position 3 cm FTN.  There was posterior shadowing. No abnormal lymphadenopathy.   - Left Targeted ultrasound 12/30/2016 ([place]) revealed at this location there was a small mass measuring approximately 8 mm x 5 mm x 7 mm with associated hematoma. The clip marker seen adjacent to the small mass. The findings were consistent with recently diagnosed malignancy. No other satellite lesions or suspicious findings were present.    REPRODUCTIVE HEALTH:  Age at first Menarche:  28  Age at First Live Birth:  67  Age at Menopause:  25  Gravida:  4  Para: 3  Breastfeeding:  no    PROCEDURE: Left rsl lumpectomy 01/28/2017 [facility])  PERTINENT PMH:  Fibromyalgia, colon polyps, osteoporosis, TIA-1980s, thyroidectomy, Mitral  valve/tricuspid prolapse, RBBB, DM type 2, HLP  FAMILY HISTORY:  Maternal cousin - breast cancer (66). No family history of ovarian cancer   MEDICAL ONCOLOGY:    Dr. Willette Pa   Present Therapy: Tamoxifen started 07/10/2017, stopped. Given script for Femara but has not started  REFERRED BY:  Dr. Wilford Grist        Review of Systems    Constitutional: Negative for fever, chills, appetite change and fatigue.   HENT: Negative for hearing loss, congestion, rhinorrhea and tinnitus. Eyes: Negative for pain, discharge and itching.   Respiratory: Negative for cough, chest tightness and shortness of breath.    Cardiovascular: Negative for chest pain and palpitations.   Gastrointestinal: Negative for abdominal distention, pain, nausea, vomiting, and diarrhea.   Genitourinary: Negative for frequency, vaginal bleeding, difficulty urinating and pelvic pain.   Musculoskeletal: Negative for myalgias, back pain, joint swelling and arthralgias.   Skin: Negative for rash.   Neurological: Negative for dizziness, weakness, light-headedness and headaches.   Hematological: Does not bruise/bleed easily.   Psychiatric/Behavioral: Negative for disturbed wake/sleep cycle. The patient is not nervous/anxious.    Allergies   Allergen Reactions   ??? Adhesive RASH and ITCHING   ??? Betadine [Povidone-Iodine] RASH     Reaction from scrub they used for surgery   ??? Ciprofloxacin MUSCLE PAIN   ??? Latex RASH     The following medical/surgical/family/social history and the list of medications are current, as of 08/30/2018    Medical History:   Diagnosis Date   ??? Acquired hypothyroidism    ??? Arthritis    ??? Back pain    ??? DM (diabetes mellitus), type 2 (HCC)    ??? Fatty liver    ??? Fibromyalgia    ??? Fibromyalgia    ??? Gastrointestinal disorder     colon polyps   ??? Hearing reduced    ??? Hepatitis 1960's    Hep A   ??? HTN (hypertension)    ??? Interstitial cystitis    ??? Irregular heart beat     ST and BBB   ??? Metabolic syndrome    ??? Migraines    ??? Mitral valve prolapse    ??? Neuropathy    ??? Osteoporosis    ??? Sleep apnea     doesn't have CPAP machine   ??? TIA (transient ischemic attack) 1970's-1980's    TIA's - pt states multiple    ??? Vertigo    ??? Vision decreased      Surgical History:   Procedure Laterality Date   ??? HX APPENDECTOMY  1981   ??? HX TONSILLECTOMY  1988   ??? THYROIDECTOMY  2015   ??? LEFT RADIOACTIVE SEED LOCALIZED LUMPECTOMY Left 01/28/2017    Performed by Stanton Kidney, MD at [facility] OR   ??? COLONOSCOPY ??? HX HYSTERECTOMY  1990's   ??? OVARIAN CYST REMOVAL  1990's     Family History   Problem Relation Age of Onset   ??? Hypertension Mother    ??? High Cholesterol Mother    ??? Diabetes Mother    ??? Migraines Mother    ??? Heart Disease Father    ??? High Cholesterol Father    ??? Arthritis-rheumatoid Father    ??? Rashes/Skin Problems Father    ??? Diabetes Sister    ??? Hypertension Sister    ??? Rashes/Skin Problems Sister    ??? Diabetes Maternal Grandmother    ??? Diabetes Maternal Grandfather      Social History  Socioeconomic History   ??? Marital status: Married     Spouse name: Not on file   ??? Number of children: Not on file   ??? Years of education: Not on file   ??? Highest education level: Not on file   Occupational History   ??? Not on file   Tobacco Use   ??? Smoking status: Never Smoker   ??? Smokeless tobacco: Never Used   Substance and Sexual Activity   ??? Alcohol use: No     Frequency: Never   ??? Drug use: No   ??? Sexual activity: Not on file   Other Topics Concern   ??? Not on file   Social History Narrative   ??? Not on file       Objective:         ??? cranberry fruit 400 mg tab Take  by mouth.   ??? Ginger (Zingiber officinalis) (GINGER EXTRACT) 250 mg cap Take  by mouth.   ??? hydroCHLOROthiazide (HYDRODIURIL) 25 mg tablet Take 1 tablet by mouth.   ??? insulin aspart U-100 (NOVOLOG U-100 INSULIN ASPART) 100 unit/mL injection Inject 10 Units under the skin three times daily with meals.   ??? insulin glargine U-300 conc (TOUJEO SOLOSTAR) 300 unit/mL (1.5 mL) injectable Inject  under the skin daily.   ??? insulin lispro (HUMALOG KWIKPEN INSULIN) 100 unit/mL injection PEN Inject 30 Units under the skin three times daily with meals.   ??? letrozole (FEMARA) 2.5 mg tablet Take one tablet by mouth daily.   ??? levothyroxine (SYNTHROID) 125 mcg tablet Take 125 mcg by mouth daily 30 minutes before breakfast.   ??? loperamide (IMODIUM A-D) 2 mg capsule Take 2 mg by mouth as Needed for Diarrhea. Take 2 capsules by mouth initially, followed by 1 capsule by mouth after each loose stool up to a maximum of 8 tablets in 24 hours.   ??? losartan(+) (COZAAR) 100 mg tablet Take 100 mg by mouth daily.   ??? metoprolol XL (TOPROL XL) 50 mg extended release tablet Take 50 mg by mouth twice daily.   ??? turmeric 400 mg cap Take  by mouth.     Vitals:    08/30/18 1306   BP: (!) 146/84   BP Source: Arm, Left Upper   Patient Position: Sitting   Pulse: 77   Resp: 16   Temp: 36.8 ???C (98.2 ???F)   TempSrc: Temporal   SpO2: 98%   Weight: 82.6 kg (182 lb)   Height: 157.5 cm (62)   PainSc: Zero     Body mass index is 33.29 kg/m???.     Pain Score: Zero       Fatigue Scale: 8    Pain Addressed:  N/A    Patient Evaluated for a Clinical Trial: No treatment clinical trial available for this patient.     [place] Cooperative Oncology Group performance status is 0, Fully active, able to carry on all pre-disease performance without restriction.Marland Kitchen     Physical Exam  Vitals signs reviewed.          RIGHT BREAST EXAM:  Breast:  No palpable breast masses. No skin, nipple, or areolar change.  Skin Erythema:  No  Attachment of Overlying Skin:  No  Peau d' orange:  No  Chest Wall Attachment:  No  Nipple Inversion:  No  Nipple Discharge: No    LEFT BREAST EXAM:  Breast: No palpable breast masses. No skin, nipple, or areolar change.  Skin Erythema:  No  Attachment of  Overlying Skin:  No  Peau d' orange:  No  Chest Wall Attachment: No  Nipple Inversion:  No  Nipple Discharge:  No    RIGHT NODAL BASIN EXAM:  Axillary:  negative  Infraclavicular:  negative  Supraclavicular:  negative    LEFT NODAL BASIN EXAM:  Axillary:  negative  Infraclavicular: negative  Supraclavicular:  negative      Constitutional: No acute distress.  HEENT:  Head: Normocephalic and atraumatic.  Eyes: No discharge. No scleral icterus.  Pulmonary/Chest: No respiratory distress.   Neurological: Alert and oriented to person, place and time. No cranial nerve deficit.  Skin: Warm and dry. No rash noted. No erythema. No pallor. Psychiatric: Normal mood and affect. Behavior is normal. Judgement and thought content normal.       Assessment and Plan:  DIAGNOSIS:  Left grade 2 DCIS (ER 99%, PR 99%) dx 12/2016    Alexis Lewis presents to clinic today for routine follow up. She has no evidence of local recurrence.  She is scheduled for screening mammogram following exam, I will contact her with the results when they are available. She continues to follow with Dr. Willette Pa.  She reports that she stopped tamoxifen due to side effects and was given a prescription for Femara but has not started this yet.  She indicated she is concerned about the potential side effects.  We discussed these concerns at length and I encouraged her to consider a trial of the medication.  If she still is unable to trial I encouraged her to contact Dr. Albin Fischer office to discuss further.  She will return to clinic in 1 year with screening mammogram at that time. She was given ample time to ask questions all of which were answered to her satisfaction. She was encouraged to call with any interval questions or concerns.     1. Continue following with Dr. Willette Pa  2. Screening mammogram today  3. Schedule screening mammogram in 1 year  4. Return to clinic in 1 year    Alease Medina, [place]

## 2018-10-24 ENCOUNTER — Encounter: Admit: 2018-10-24 | Discharge: 2018-10-24 | Payer: MEDICARE

## 2018-10-24 LAB — LIVER FUNCTION PANEL
Lab: 0.1 mg/dL (ref <0.4)
Lab: 1.9 mg/dL — ABNORMAL HIGH (ref 0.3–1.2)
Lab: 4.8 g/dL (ref 3.5–5.0)
Lab: 41 U/L — ABNORMAL HIGH (ref 7–40)
Lab: 44 U/L (ref 7–56)
Lab: 7.7 g/dL (ref 6.0–8.0)
Lab: 76 U/L (ref 25–110)

## 2018-10-24 NOTE — Progress Notes
Name: Alexis Lewis          MRN: [id number]      DOB: 25-May-1950      AGE: 68 y.o.   DATE OF SERVICE: 10/24/2018    Subjective:             Reason for Visit: Breast cancer follow-up visit  Heme/Onc Care      Alexis Lewis is a 68 y.o. female.     Cancer Staging  Ductal carcinoma in situ (DCIS) of left breast  Staging form: Breast, AJCC 8th Edition  - Clinical stage from 12/30/2016: Stage 0 (cTis (DCIS), cN0, cM0, ER: Positive, PR: Positive, HER2: Not assessed ) - Signed by Alease Medina, PA-C on 12/30/2016  - Pathologic stage from 02/04/2017: Stage 0 (pTis (DCIS), pN0, cM0, ER: Positive, PR: Positive, HER2: Not assessed ) - Signed by Alease Medina, PA-C on 02/04/2017      History of Present Illness  Alexis Lewis is seen today in follow-up of her left breast DCIS diagnosed November 2018.  She is status post left lumpectomy with Dr. Leveda Anna followed by adjuvant radiotherapy completed in March 2019.  She initially tolerated tamoxifen well but did have some issues with insomnia discontinued this in October 2019.  She saw Mik in follow-up in June 2020 and letrozole was recommended but she had some arthralgias with this which then resolved when she stopped the medication.    She is overall doing well but does have some slight fatigue.  On her lab work in June she did have a slight elevation in bilirubin so was to follow-up with her PCP Dr. Alona Bene.  Drawing a liver profile today.  Also drawing a CBC as she has some mild fatigue.    She saw Alease Medina in follow-up 08/30/2018 and will see Dr. Leveda Anna in October 2020 with mammograms.    Oncology History:  ?  CHIEF COMPLAINT:?????  2.4 cm, pTis N0 M0?, Stage 0, ?Ductal Carcinoma in- Situ of the Left Breast.  Location: Left Breast, 6;00 Position, 4 cm From the Nipple.  Grade 2.  Patterrn: ?Solid and Solid Papillary.  Negative Margins.  ER+?(99%)  PR+?(96%)  Ki-67 index: ?18%  ?  PREVIOUS PROCEDURES / TREATMENTS:  12/10/16 ?Left Breast Core Biopsy [facility]). 01/28/17 ?Left Breast Lumpectomy ([facility]).  04/21/17 ?Completed Left Breast Hypofractionated Radiation to 5205 cGy.  07/10/17??Tamoxifen post Radiation per Dr. Willette Pa.  11/2017: Discontinue tamoxifen  June/2020 initiated letrozole but tolerated this just for a few weeks.  ?  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  Past surgical history:  has a past surgical history that includes ovarian cyst removal (1990's); colonoscopy; tonsillectomy (1988); appendectomy (0865); thyroidectomy (2015); hysterectomy (1990's); and Mastectomy, partial (Left, 01/28/2017) (LEFT RADIOACTIVE SEED LOCALIZED LUMPECTOMY performed by Stanton Kidney, MD at [facility] OR/PERIOP).  Family history:  family history includes Arthritis-rheumatoid in her father; Diabetes in her maternal grandfather, maternal grandmother, mother, and sister; Heart Disease in her father; High Cholesterol in her father and mother; Hypertension in her mother and sister; Migraines in her mother; Rashes/Skin Problems in her father and sister.  Social history:  reports that she has never smoked. She has never used smokeless tobacco. She reports that she does not drink alcohol or use drugs.  ?     Review of Systems  Mild fatigue but no other complaints besides some urinary issues.  Constitutional: Negative for fever and chills.   HENT: Negative for ear pain, sore throat, mouth sores, trouble swallowing, neck pain and sinus pressure. Eyes: Negative for visual disturbance.   Respiratory: Negative for shortness of breath.    Cardiovascular: Negative for chest pain.   Gastrointestinal: Negative for nausea, diarrhea, constipation and blood in stool.   Genitourinary: Urinary incontinence and some dysuria at times.  Bladder issues.  Musculoskeletal: Negative.    Neurological: Negative for weakness, numbness and headaches.   Hematological: Does not bruise/bleed easily.   Psychiatric/Behavioral: Negative for confusion and decreased concentration. The patient is not nervous/anxious.        Objective:         ? cranberry fruit 400 mg tab Take  by mouth.   ? Ginger (Zingiber officinalis) (GINGER EXTRACT) 250 mg cap Take  by mouth.   ? hydroCHLOROthiazide (HYDRODIURIL) 25 mg tablet Take 1 tablet by mouth.   ? insulin aspart U-100 (NOVOLOG U-100 INSULIN ASPART) 100 unit/mL injection Inject 10 Units under the skin three times daily with meals.   ? insulin glargine U-300 conc (TOUJEO SOLOSTAR) 300 unit/mL (1.5 mL) injectable Inject  under the skin daily.   ? insulin lispro (HUMALOG KWIKPEN INSULIN) 100 unit/mL injection PEN Inject 30 Units under the skin three times daily with meals.   ? levothyroxine (SYNTHROID) 125 mcg tablet Take 125 mcg by mouth daily 30 minutes before breakfast.   ? loperamide (IMODIUM A-D) 2 mg capsule Take 2 mg by mouth as Needed for Diarrhea. Take 2 capsules by mouth initially, followed by 1 capsule by mouth after each loose stool up to a maximum of 8 tablets in 24 hours.   ? losartan(+) (COZAAR) 100 mg tablet Take 100 mg by mouth daily.   ? metoprolol XL (TOPROL XL) 50 mg extended release tablet Take 50 mg by mouth twice daily.   ? turmeric 400 mg cap Take  by mouth.     Vitals:    10/24/18 1410 10/24/18 1417   BP: (!) 163/81    Pulse: 83    Resp: 18    Temp: 36.7 ?C (98 ?F)    TempSrc: Temporal Temporal   SpO2: 97%    Weight: 80.2 kg (176 lb 12.8 oz)    Height: 157.5 cm (62)    PainSc: Seven      Body mass index is 32.34 kg/m?Marland Kitchen Pain Score: Seven  Pain Loc: (bladder)    Fatigue Scale: 7    Pain Addressed:  N/A    Patient Evaluated for a Clinical Trial: Patient not eligible for a treatment trial (including not needing treatment, needs palliative care, in remission).     [place] Cooperative Oncology Group performance status is 0, Fully active, able to carry on all pre-disease performance without restriction.Marland Kitchen     Physical Exam     Very pleasant conversant well-appearing Caucasian female in no distress.  HEENT: No abnormalities noted.  Cardiac: Regular.  Pulmonary: Clear.  Breasts: Right breast without mass or adenopathy.  Left breast with minimal well-healed postoperative changes.  No palpable mass or adenopathy.  Extremities: No lymphedema.  Neuro: Alert and oriented x3.     Assessment and Plan:  Ductal carcinoma in situ of left breast  Status  post lumpectomy followed by radiotherapy.  Initially tolerated tamoxifen but did discontinue this in October 2019.  Tried letrozole briefly in June 2020 but did not tolerate this.  She will see Dr. Leveda Anna in October 2020 with mammograms.  We will plan follow-up here in the summer 2021 with clinical examination.    Slight elevation in bilirubin  Lab work and June 2020 had slight elevation in bilirubin.  We are rechecking her liver profile today.  We will also check a CBC as she does have some fatigue.

## 2018-11-14 NOTE — Telephone Encounter
-----   Message from Mathis Dad, MD sent at 11/14/2018  9:04 AM CDT -----  B mmg is benign   patient will be notified   Follow up as planned   Copy forwarded to Dr Shon Hough, Evangeline Gula

## 2018-11-14 NOTE — Telephone Encounter
LVM for patient. Dr. Bea Graff reviewed recent imaging results and they were fine. Direct contact information was given for questions.

## 2018-12-28 ENCOUNTER — Encounter: Admit: 2018-12-28 | Discharge: 2018-12-28 | Payer: MEDICARE

## 2019-01-10 ENCOUNTER — Encounter: Admit: 2019-01-10 | Discharge: 2019-01-10 | Payer: MEDICARE

## 2019-01-10 ENCOUNTER — Ambulatory Visit: Admit: 2019-01-10 | Discharge: 2019-01-10 | Payer: MEDICARE

## 2019-01-10 DIAGNOSIS — R079 Chest pain, unspecified: Secondary | ICD-10-CM

## 2019-03-17 ENCOUNTER — Encounter: Admit: 2019-03-17 | Discharge: 2019-03-17 | Payer: MEDICARE

## 2019-03-17 NOTE — Telephone Encounter
-----   Message from Real sent at 03/17/2019  2:06 PM CST -----  Regarding: Medical records  Hello,    Per PCP - Dr. Jackie Plum [phone]; fax [phone] Dx: R07.9 - Chest pain, G89-29 - Other Chronic Pain, & Z91.89 - Other specified person risk factors, not elsewhere classified / Medicare & [employer] Medicare / Last seen by Dr. Breck Coons in 2018 / Visit notes, lab results, & surgical hx received from PCP - additional records requested from PCP.    Records may have already been requested.    Appt scheduled with Dr.Love, 06/16 @ 9:20am in [place].    Thanks!

## 2019-03-24 ENCOUNTER — Encounter: Admit: 2019-03-24 | Discharge: 2019-03-24 | Payer: MEDICARE

## 2019-03-24 DIAGNOSIS — E119 Type 2 diabetes mellitus without complications: Secondary | ICD-10-CM

## 2019-03-24 DIAGNOSIS — G43909 Migraine, unspecified, not intractable, without status migrainosus: Secondary | ICD-10-CM

## 2019-03-24 DIAGNOSIS — G4730 Sleep apnea, unspecified: Secondary | ICD-10-CM

## 2019-03-24 DIAGNOSIS — E8881 Metabolic syndrome: Secondary | ICD-10-CM

## 2019-03-24 DIAGNOSIS — M810 Age-related osteoporosis without current pathological fracture: Secondary | ICD-10-CM

## 2019-03-24 DIAGNOSIS — K759 Inflammatory liver disease, unspecified: Secondary | ICD-10-CM

## 2019-03-24 DIAGNOSIS — I499 Cardiac arrhythmia, unspecified: Secondary | ICD-10-CM

## 2019-03-24 DIAGNOSIS — K760 Fatty (change of) liver, not elsewhere classified: Secondary | ICD-10-CM

## 2019-03-24 DIAGNOSIS — H547 Unspecified visual loss: Secondary | ICD-10-CM

## 2019-03-24 DIAGNOSIS — G629 Polyneuropathy, unspecified: Secondary | ICD-10-CM

## 2019-03-24 DIAGNOSIS — M1990 Unspecified osteoarthritis, unspecified site: Secondary | ICD-10-CM

## 2019-03-24 DIAGNOSIS — I10 Essential (primary) hypertension: Secondary | ICD-10-CM

## 2019-03-24 DIAGNOSIS — M549 Dorsalgia, unspecified: Secondary | ICD-10-CM

## 2019-03-24 DIAGNOSIS — E039 Hypothyroidism, unspecified: Secondary | ICD-10-CM

## 2019-03-24 DIAGNOSIS — M797 Fibromyalgia: Secondary | ICD-10-CM

## 2019-03-24 DIAGNOSIS — R42 Dizziness and giddiness: Secondary | ICD-10-CM

## 2019-03-24 DIAGNOSIS — K929 Disease of digestive system, unspecified: Secondary | ICD-10-CM

## 2019-03-24 DIAGNOSIS — I341 Nonrheumatic mitral (valve) prolapse: Secondary | ICD-10-CM

## 2019-03-24 DIAGNOSIS — G459 Transient cerebral ischemic attack, unspecified: Secondary | ICD-10-CM

## 2019-03-24 DIAGNOSIS — H9190 Unspecified hearing loss, unspecified ear: Secondary | ICD-10-CM

## 2019-03-24 DIAGNOSIS — N3010 Interstitial cystitis (chronic) without hematuria: Secondary | ICD-10-CM

## 2019-03-28 ENCOUNTER — Encounter: Admit: 2019-03-28 | Discharge: 2019-03-28 | Payer: MEDICARE

## 2019-04-13 ENCOUNTER — Encounter: Admit: 2019-04-13 | Discharge: 2019-04-13 | Payer: MEDICARE

## 2019-04-13 DIAGNOSIS — R42 Dizziness and giddiness: Secondary | ICD-10-CM

## 2019-04-13 DIAGNOSIS — R0602 Shortness of breath: Secondary | ICD-10-CM

## 2019-04-13 DIAGNOSIS — K929 Disease of digestive system, unspecified: Secondary | ICD-10-CM

## 2019-04-13 DIAGNOSIS — I10 Essential (primary) hypertension: Secondary | ICD-10-CM

## 2019-04-13 DIAGNOSIS — M810 Age-related osteoporosis without current pathological fracture: Secondary | ICD-10-CM

## 2019-04-13 DIAGNOSIS — N3010 Interstitial cystitis (chronic) without hematuria: Secondary | ICD-10-CM

## 2019-04-13 DIAGNOSIS — G459 Transient cerebral ischemic attack, unspecified: Secondary | ICD-10-CM

## 2019-04-13 DIAGNOSIS — R079 Chest pain, unspecified: Secondary | ICD-10-CM

## 2019-04-13 DIAGNOSIS — M797 Fibromyalgia: Secondary | ICD-10-CM

## 2019-04-13 DIAGNOSIS — H547 Unspecified visual loss: Secondary | ICD-10-CM

## 2019-04-13 DIAGNOSIS — I341 Nonrheumatic mitral (valve) prolapse: Secondary | ICD-10-CM

## 2019-04-13 DIAGNOSIS — G43909 Migraine, unspecified, not intractable, without status migrainosus: Secondary | ICD-10-CM

## 2019-04-13 DIAGNOSIS — I499 Cardiac arrhythmia, unspecified: Secondary | ICD-10-CM

## 2019-04-13 DIAGNOSIS — G4730 Sleep apnea, unspecified: Secondary | ICD-10-CM

## 2019-04-13 DIAGNOSIS — K759 Inflammatory liver disease, unspecified: Secondary | ICD-10-CM

## 2019-04-13 DIAGNOSIS — M549 Dorsalgia, unspecified: Secondary | ICD-10-CM

## 2019-04-13 DIAGNOSIS — E039 Hypothyroidism, unspecified: Secondary | ICD-10-CM

## 2019-04-13 DIAGNOSIS — E8881 Metabolic syndrome: Secondary | ICD-10-CM

## 2019-04-13 DIAGNOSIS — G629 Polyneuropathy, unspecified: Secondary | ICD-10-CM

## 2019-04-13 DIAGNOSIS — K760 Fatty (change of) liver, not elsewhere classified: Secondary | ICD-10-CM

## 2019-04-13 DIAGNOSIS — H9190 Unspecified hearing loss, unspecified ear: Secondary | ICD-10-CM

## 2019-04-13 DIAGNOSIS — E119 Type 2 diabetes mellitus without complications: Secondary | ICD-10-CM

## 2019-04-13 DIAGNOSIS — M1990 Unspecified osteoarthritis, unspecified site: Secondary | ICD-10-CM

## 2019-04-13 NOTE — Progress Notes
No prior auth required  Call Ref# [id number]    Order for echo faxed to [facility]    PFT order also faxed to [facility]- they do their own PA for PFT.

## 2019-04-14 ENCOUNTER — Encounter: Admit: 2019-04-14 | Discharge: 2019-04-14 | Payer: MEDICARE

## 2019-04-14 DIAGNOSIS — D0512 Intraductal carcinoma in situ of left breast: Secondary | ICD-10-CM

## 2019-04-14 DIAGNOSIS — I10 Essential (primary) hypertension: Secondary | ICD-10-CM

## 2019-04-14 DIAGNOSIS — R079 Chest pain, unspecified: Secondary | ICD-10-CM

## 2019-04-14 DIAGNOSIS — I341 Nonrheumatic mitral (valve) prolapse: Secondary | ICD-10-CM

## 2019-04-14 DIAGNOSIS — R0602 Shortness of breath: Secondary | ICD-10-CM

## 2019-04-14 LAB — CBC
Lab: 30
Lab: 32
Lab: 7.3
Lab: 93
Lab: 14
Lab: 4.7
Lab: 44

## 2019-04-14 LAB — LIVER FUNCTION PANEL: Lab: 1.2 mL/min — ABNORMAL HIGH (ref 0.2–1.2)

## 2019-04-14 LAB — THYROID STIMULATING HORMONE-TSH: Lab: 2.6 — ABNORMAL HIGH (ref <150)

## 2019-04-14 LAB — LIPID PROFILE
Lab: 133 — ABNORMAL HIGH (ref <100)
Lab: 216 — ABNORMAL HIGH (ref <200)
Lab: 36 — ABNORMAL LOW (ref >=40)
Lab: 47 — ABNORMAL HIGH (ref 5–40)
Lab: 6 — ABNORMAL HIGH (ref 0–5)

## 2019-04-14 LAB — BASIC METABOLIC PANEL: Lab: 138 mL/min (ref >60)

## 2019-04-14 LAB — MAGNESIUM: Lab: 1.6

## 2019-04-17 ENCOUNTER — Encounter: Admit: 2019-04-17 | Discharge: 2019-04-17 | Payer: MEDICARE

## 2019-04-17 DIAGNOSIS — I10 Essential (primary) hypertension: Secondary | ICD-10-CM

## 2019-04-17 MED ORDER — REPATHA SURECLICK 140 MG/ML SC PNIJ
140 mg | SUBCUTANEOUS | 11 refills | 28.00000 days | Status: DC
Start: 2019-04-17 — End: 2019-04-21

## 2019-04-17 MED ORDER — REPATHA SURECLICK 140 MG/ML SC PNIJ
140 mg | SUBCUTANEOUS | 11 refills | 28.00000 days | Status: DC
Start: 2019-04-17 — End: 2019-04-17

## 2019-04-17 MED ORDER — POTASSIUM CHLORIDE 10 MEQ PO CPER
10 meq | ORAL_CAPSULE | Freq: Every day | ORAL | 3 refills | 30.00000 days | Status: DC
Start: 2019-04-17 — End: 2019-07-25

## 2019-04-17 NOTE — Telephone Encounter
-----   Message from Gwyndolyn Saxon Darnelle Maffucci) Belva Chimes, MD sent at 04/15/2019  2:15 PM CST -----  Start her on potassium 10 meq daily. Recheck BMP in one week. Regarding her lipids we should be getting approval for Repatha to start soon.     Thanks!  ----- Message -----  From: Katina Degree, RN  Sent: 04/14/2019   2:57 PM CST  To: Arlester Marker) Belva Chimes, MD    Lab results for your review and recommendations.

## 2019-04-17 NOTE — Telephone Encounter
04/17/2019 1:44 PM   Order for repatha entered and asked specialty pharmacy to assist with PA.Discussed with patient and sent a list of possible side effects from the [employer] website via [place] for the patient to review. Geralyn Corwin, RN        Love, Arlester Marker) Darene Lamer, MD  Katina Degree, RN    Demetrios Isaacs. Lets go ahead and see if we can get it approved. She does have diabetes.

## 2019-04-17 NOTE — Telephone Encounter
Patient notified of lab results and recommendations of beginning potassium with a repeat lab draw. Prescription sent to [facility] in [place] and lab order faxed to [place]. Patient message sent to Dr. Erling Cruz to clarify whether or not he would like to pursue Repatha at this time.

## 2019-04-19 ENCOUNTER — Encounter: Admit: 2019-04-19 | Discharge: 2019-04-19 | Payer: MEDICARE

## 2019-04-19 NOTE — Progress Notes
A prior authorization for Repatha is pending for [employer].  The following additional information is required to complete the prior authorization:    Insurance prefers [employer]    The specialty pharmacy team has contacted the provider's nurse Mickie Bail. to notify them of the above requirements.  The specialty team will submit the prior authorization once all of the information is complete.    [place] Patient Advocate  [phone]

## 2019-04-20 ENCOUNTER — Encounter: Admit: 2019-04-20 | Discharge: 2019-04-20 | Payer: MEDICARE

## 2019-04-21 ENCOUNTER — Ambulatory Visit: Admit: 2019-04-21 | Discharge: 2019-04-21 | Payer: MEDICARE

## 2019-04-21 ENCOUNTER — Encounter: Admit: 2019-04-21 | Discharge: 2019-04-21 | Payer: MEDICARE

## 2019-04-21 DIAGNOSIS — I10 Essential (primary) hypertension: Secondary | ICD-10-CM

## 2019-04-21 DIAGNOSIS — R0602 Shortness of breath: Secondary | ICD-10-CM

## 2019-04-21 MED ORDER — ALIROCUMAB 75 MG/ML SC PNIJ
75 mg | SUBCUTANEOUS | 11 refills | 28.00000 days | Status: DC
Start: 2019-04-21 — End: 2019-07-25

## 2019-04-24 ENCOUNTER — Encounter: Admit: 2019-04-24 | Discharge: 2019-04-24 | Payer: MEDICARE

## 2019-04-24 NOTE — Progress Notes
The Prior Authorization for Praluent was submitted for [facility] via Cover My Meds.  Will continue to follow.    [place] Patient Advocate  [phone]

## 2019-04-24 NOTE — Progress Notes
The Prior Authorization for Praluent was approved for [employer] until further notice.  The copay is $461.70 due to a high deductible.    Left patient a voicemail to notify her of the approval/copay and want to let her know about assistance through the [employer] grant.    [place] Patient Advocate  [phone]

## 2019-04-25 ENCOUNTER — Encounter: Admit: 2019-04-25 | Discharge: 2019-04-25 | Payer: MEDICARE

## 2019-04-28 ENCOUNTER — Encounter: Admit: 2019-04-28 | Discharge: 2019-04-28 | Payer: MEDICARE

## 2019-04-28 NOTE — Telephone Encounter
-----   Message from Arlester Marker) Alexis Chimes, MD sent at 04/27/2019  5:27 PM CDT -----  Hey team,    Would you all mind reaching out to [facility] and letting her know that her transthoracic echocardiogram looked great.  Normal heart pump function.  No significant valvular disease.    Thanks!

## 2019-04-28 NOTE — Telephone Encounter
Results and recommendations called to patient.

## 2019-06-12 IMAGING — MG MAMMOGRAM 3D DX, LEFT
8 series · 8 of 8 positions shown · non-contrast
Comparison: none

[L CC (1 of 2)]
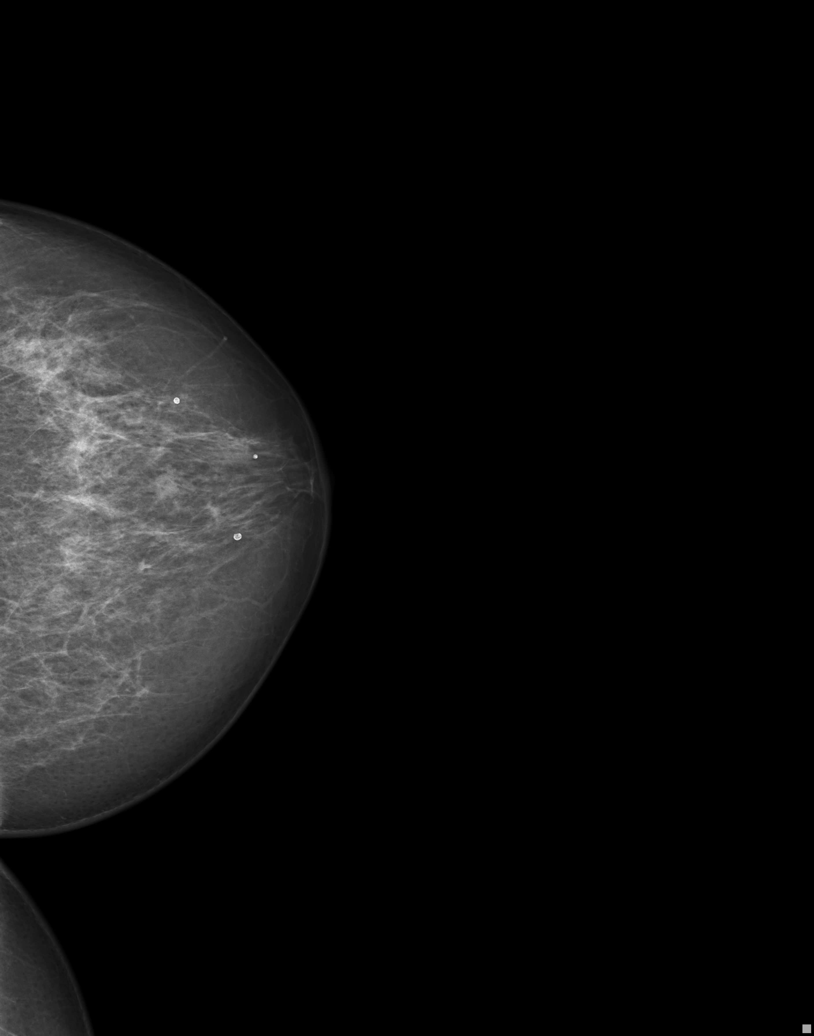

[L tomo (1 of 2)]
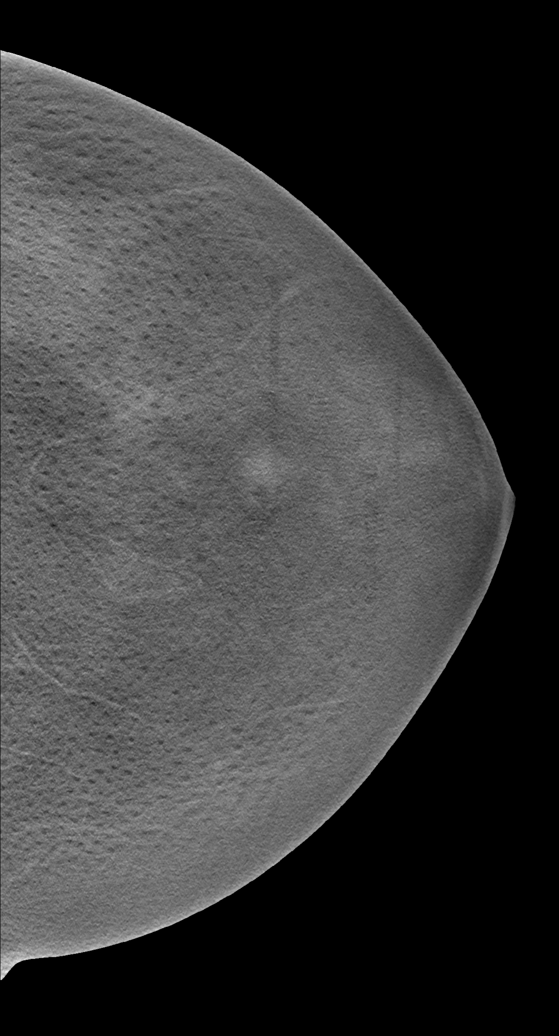

[L CC (2 of 2)]
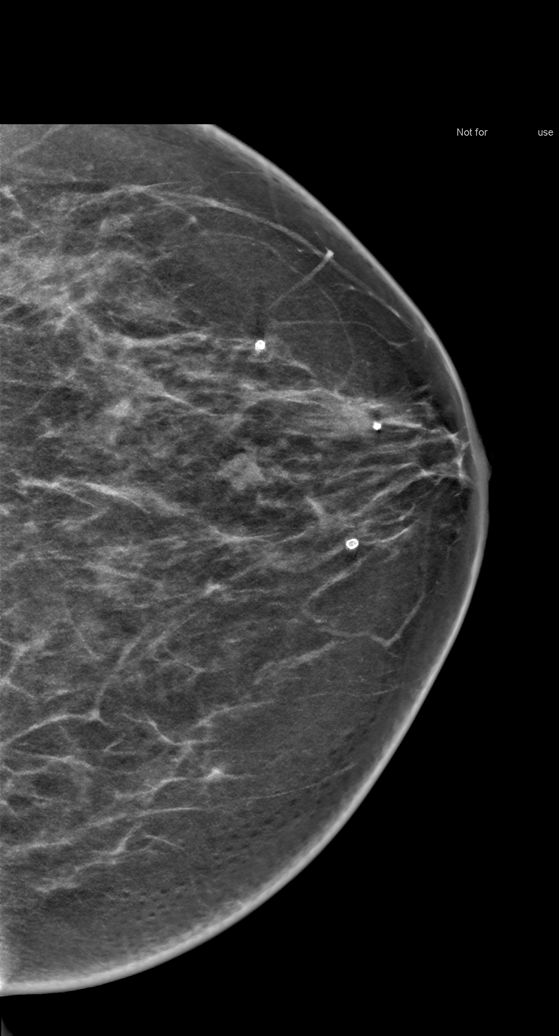

[L (1 of 2)]
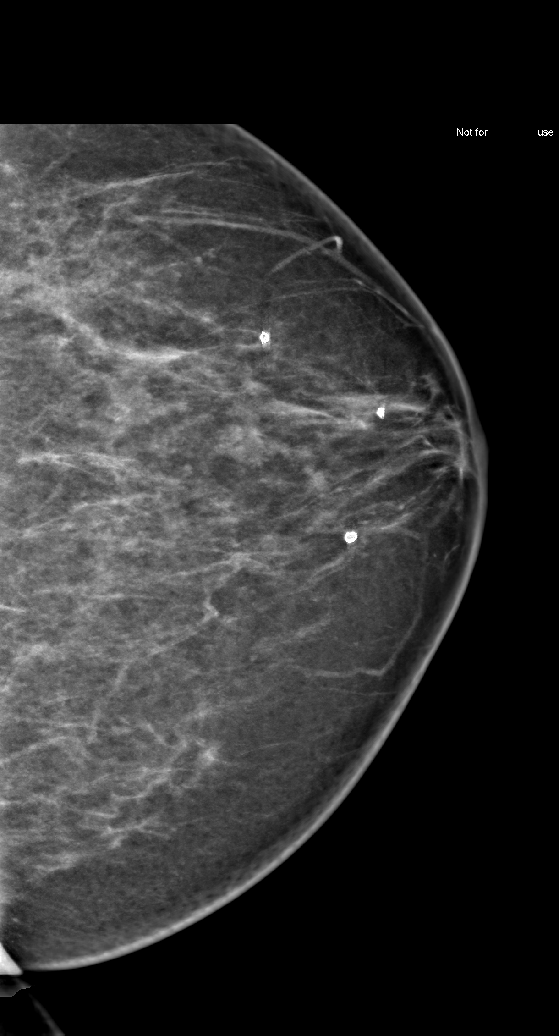

[L MLO (1 of 2)]
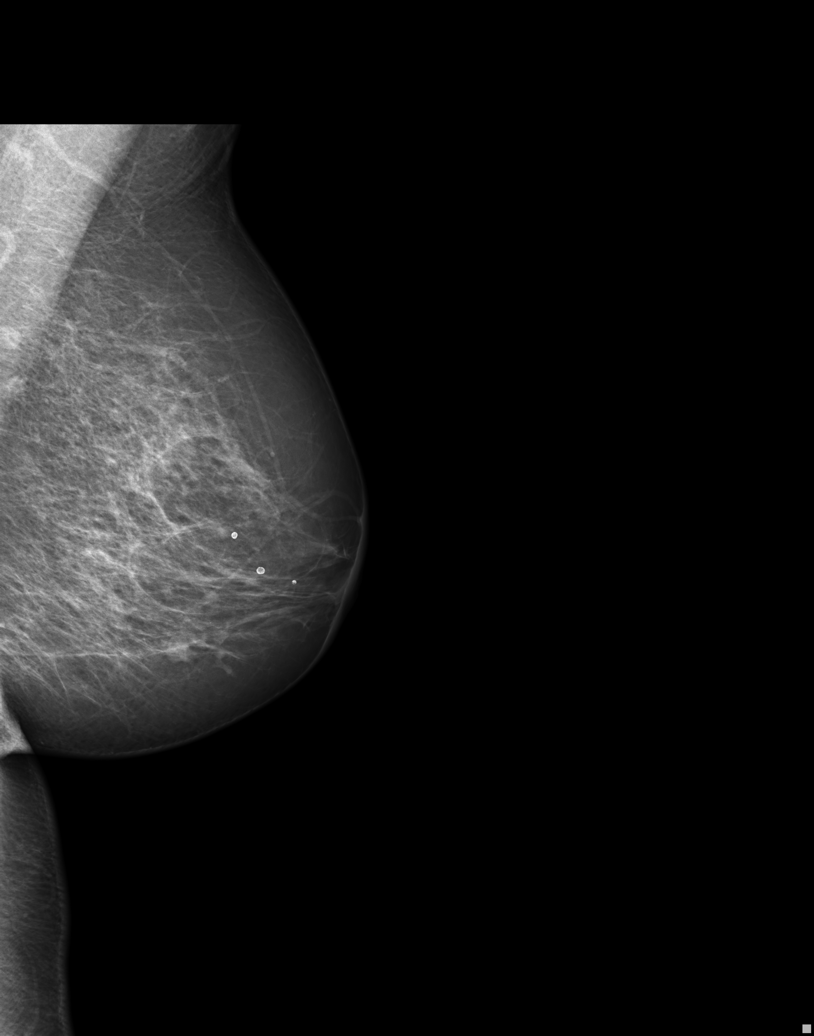

[L tomo (2 of 2)]
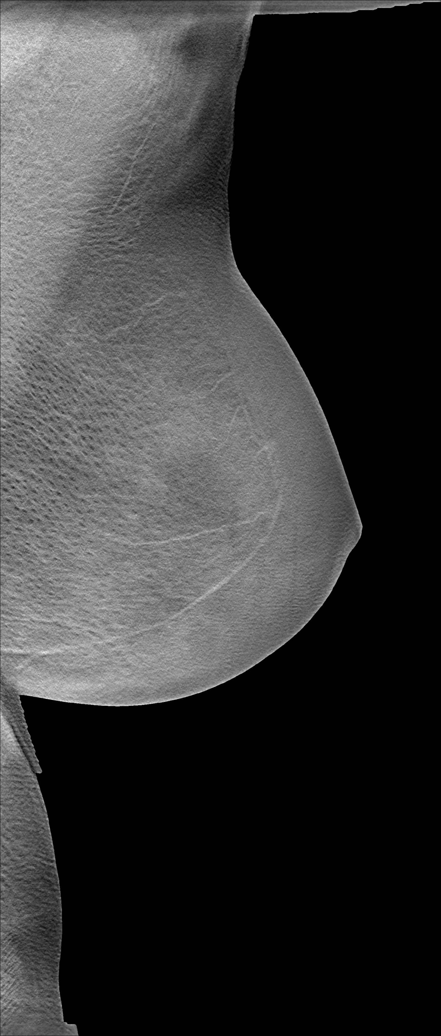

[L MLO (2 of 2)]
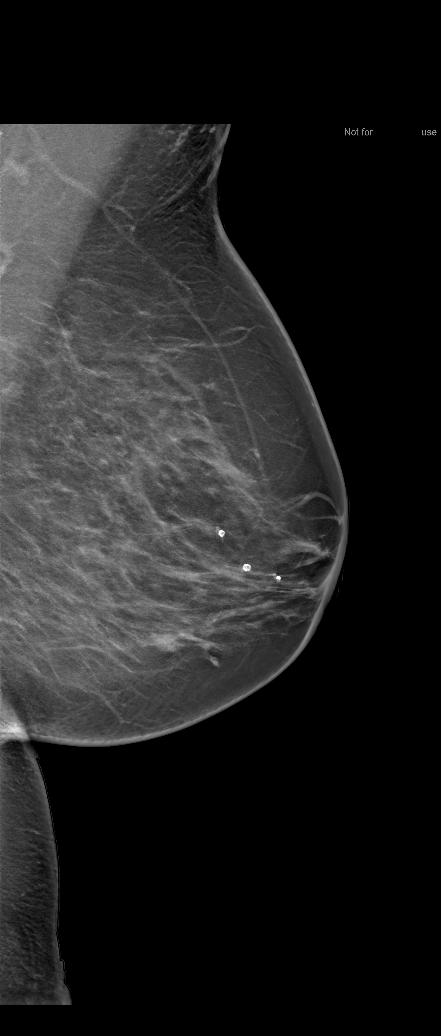

[L (2 of 2)]
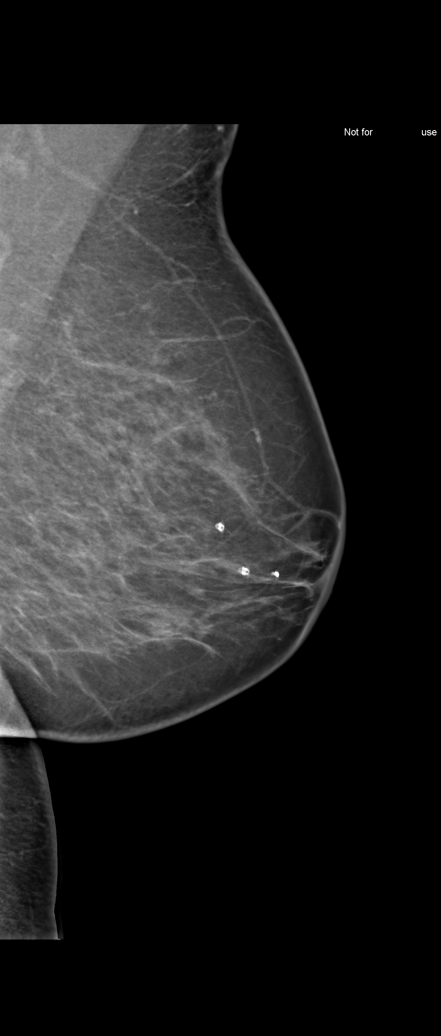

[8 of 8 positions shown; findings below may reference images not displayed]

DIAGNOSTIC STUDIES

EXAM

Diagnostic digital left mammogram with tomosynthesis

INDICATION

Left breast mass

TECHNIQUE

Craniocaudal and medial lateral oblique digital mammographic images of the left breast were
obtained utilizing computer-aided detection. 2D craniocaudal medial lateral oblique projections
were obtained with 3D tomographic views per manufacturers protocol. ICAD version 7.2 was used
during this exam.

COMPARISONS

Mammograms performed [DATE] [DATE], [DATE], [DATE] [DATE], [DATE], [DATE] [DATE], [DATE], [DATE] [DATE], [DATE].

FINDINGS

There is a lobulated ovoid same mass in the left breast 4 centimeter from the nipple at the 6 o'
clock position.

IMPRESSION

BI-RADS 0, INCOMPLETE: Need Additional Imaging Evaluation.

Ultrasound of the 6 o'clock left breast mass is recommended for further evaluation. A letter of
recommendation will be sent.

## 2019-07-25 ENCOUNTER — Encounter: Admit: 2019-07-25 | Discharge: 2019-07-25 | Payer: MEDICARE

## 2019-07-25 DIAGNOSIS — E039 Hypothyroidism, unspecified: Secondary | ICD-10-CM

## 2019-07-25 DIAGNOSIS — K759 Inflammatory liver disease, unspecified: Secondary | ICD-10-CM

## 2019-07-25 DIAGNOSIS — R42 Dizziness and giddiness: Secondary | ICD-10-CM

## 2019-07-25 DIAGNOSIS — H9190 Unspecified hearing loss, unspecified ear: Secondary | ICD-10-CM

## 2019-07-25 DIAGNOSIS — I10 Essential (primary) hypertension: Secondary | ICD-10-CM

## 2019-07-25 DIAGNOSIS — M549 Dorsalgia, unspecified: Secondary | ICD-10-CM

## 2019-07-25 DIAGNOSIS — G459 Transient cerebral ischemic attack, unspecified: Secondary | ICD-10-CM

## 2019-07-25 DIAGNOSIS — M810 Age-related osteoporosis without current pathological fracture: Secondary | ICD-10-CM

## 2019-07-25 DIAGNOSIS — G629 Polyneuropathy, unspecified: Secondary | ICD-10-CM

## 2019-07-25 DIAGNOSIS — K929 Disease of digestive system, unspecified: Secondary | ICD-10-CM

## 2019-07-25 DIAGNOSIS — N3010 Interstitial cystitis (chronic) without hematuria: Secondary | ICD-10-CM

## 2019-07-25 DIAGNOSIS — G4730 Sleep apnea, unspecified: Secondary | ICD-10-CM

## 2019-07-25 DIAGNOSIS — I341 Nonrheumatic mitral (valve) prolapse: Secondary | ICD-10-CM

## 2019-07-25 DIAGNOSIS — E119 Type 2 diabetes mellitus without complications: Secondary | ICD-10-CM

## 2019-07-25 DIAGNOSIS — H547 Unspecified visual loss: Secondary | ICD-10-CM

## 2019-07-25 DIAGNOSIS — I499 Cardiac arrhythmia, unspecified: Secondary | ICD-10-CM

## 2019-07-25 DIAGNOSIS — G43909 Migraine, unspecified, not intractable, without status migrainosus: Secondary | ICD-10-CM

## 2019-07-25 DIAGNOSIS — E8881 Metabolic syndrome: Secondary | ICD-10-CM

## 2019-07-25 DIAGNOSIS — M797 Fibromyalgia: Secondary | ICD-10-CM

## 2019-07-25 DIAGNOSIS — E785 Hyperlipidemia, unspecified: Secondary | ICD-10-CM

## 2019-07-25 DIAGNOSIS — M1990 Unspecified osteoarthritis, unspecified site: Secondary | ICD-10-CM

## 2019-07-25 DIAGNOSIS — K760 Fatty (change of) liver, not elsewhere classified: Secondary | ICD-10-CM

## 2019-07-25 MED ORDER — SPIRONOLACTONE 25 MG PO TAB
12.5 mg | ORAL_TABLET | Freq: Every day | ORAL | 3 refills | 46.00000 days | Status: AC
Start: 2019-07-25 — End: ?

## 2019-07-25 NOTE — Patient Instructions
Thank you for visiting our office today.    We would like to make the following medication adjustments:      ? Stop taking potassium.  ? Start taking spironolactone 12.5 mg, this will be 1/2 of a 25 mg tablet.   ? Resume praluent for cholesterol.    Otherwise continue the same medications as you have been doing.          We will be pursuing the following tests after your appointment today:       Orders Placed This Encounter   ? BASIC METABOLIC PANEL   ? LIPID PROFILE   ? spironolactone (ALDACTONE) 25 mg tablet     Have basic metabolic panel drawn in one week after starting on spironolactone.  Have lipid profile drawn in 3 months after starting back on praluent.    We will plan to see you back in 6 months.  Please call us in the meantime with any questions or concerns.        Please allow 5-7 business days for our providers to review your results. All normal results will go to MyChart. If you do not have Mychart, it is strongly recommended to get this so you can easily view all your results. If you do not have mychart, we will attempt to call you once with normal lab and testing results. If we cannot reach you by phone with normal results, we will send you a letter.  If you have not heard the results of your testing after one week please give [place] a call.       Your Cardiovascular Medicine [place] [facility] Team Brett Canales, Pilar Jarvis and [place])  phone number is [phone].

## 2019-07-26 ENCOUNTER — Encounter: Admit: 2019-07-26 | Discharge: 2019-07-26 | Payer: MEDICARE

## 2019-07-26 MED ORDER — PRALUENT PEN 75 MG/ML SC PNIJ
75 mg | SUBCUTANEOUS | 11 refills | 28.00000 days | Status: DC
Start: 2019-07-26 — End: 2019-08-17

## 2019-08-16 ENCOUNTER — Encounter: Admit: 2019-08-16 | Discharge: 2019-08-16 | Payer: MEDICARE

## 2019-08-16 NOTE — Telephone Encounter
Spoke with patient via telephone to inquire whether she had the labs drawn ordered at her last office visit with Dr. Sandria Manly on 06/15 following medication changes. Patient was started on spironolactone 12.5 mg dly and potassium and metoprolol were discontinued. Patient states she started the spironolactone but stopped taking it about 6 days ago due to dizziness and diarrhea. She is feeling better since stopping medication. She has not had labs drawn but states she will go get them done today.     Will route to Dr. Sandria Manly for his recommendations. Patient states she would try going back on the medication but at a lower dosage.

## 2019-08-17 ENCOUNTER — Encounter: Admit: 2019-08-17 | Discharge: 2019-08-17 | Payer: MEDICARE

## 2019-08-17 DIAGNOSIS — E785 Hyperlipidemia, unspecified: Secondary | ICD-10-CM

## 2019-08-17 DIAGNOSIS — I10 Essential (primary) hypertension: Secondary | ICD-10-CM

## 2019-08-17 MED ORDER — PRALUENT PEN 75 MG/ML SC PNIJ
75 mg | SUBCUTANEOUS | 11 refills | Status: CN
Start: 2019-08-17 — End: ?

## 2019-08-17 MED ORDER — PRALUENT PEN 75 MG/ML SC PNIJ
75 mg | SUBCUTANEOUS | 11 refills | 28.00000 days | Status: DC
Start: 2019-08-17 — End: 2019-08-17

## 2019-11-27 ENCOUNTER — Encounter: Admit: 2019-11-27 | Discharge: 2019-11-27 | Payer: MEDICARE

## 2019-11-27 DIAGNOSIS — D0512 Intraductal carcinoma in situ of left breast: Secondary | ICD-10-CM

## 2019-11-29 ENCOUNTER — Encounter: Admit: 2019-11-29 | Discharge: 2019-11-29 | Payer: MEDICARE

## 2019-11-29 DIAGNOSIS — M797 Fibromyalgia: Secondary | ICD-10-CM

## 2019-11-29 DIAGNOSIS — N3010 Interstitial cystitis (chronic) without hematuria: Secondary | ICD-10-CM

## 2019-11-29 DIAGNOSIS — E119 Type 2 diabetes mellitus without complications: Secondary | ICD-10-CM

## 2019-11-29 DIAGNOSIS — E039 Hypothyroidism, unspecified: Secondary | ICD-10-CM

## 2019-11-29 DIAGNOSIS — M549 Dorsalgia, unspecified: Secondary | ICD-10-CM

## 2019-11-29 DIAGNOSIS — G629 Polyneuropathy, unspecified: Secondary | ICD-10-CM

## 2019-11-29 DIAGNOSIS — H547 Unspecified visual loss: Secondary | ICD-10-CM

## 2019-11-29 DIAGNOSIS — I10 Essential (primary) hypertension: Secondary | ICD-10-CM

## 2019-11-29 DIAGNOSIS — K759 Inflammatory liver disease, unspecified: Secondary | ICD-10-CM

## 2019-11-29 DIAGNOSIS — D0512 Intraductal carcinoma in situ of left breast: Secondary | ICD-10-CM

## 2019-11-29 DIAGNOSIS — G4730 Sleep apnea, unspecified: Secondary | ICD-10-CM

## 2019-11-29 DIAGNOSIS — E8881 Metabolic syndrome: Secondary | ICD-10-CM

## 2019-11-29 DIAGNOSIS — M810 Age-related osteoporosis without current pathological fracture: Secondary | ICD-10-CM

## 2019-11-29 DIAGNOSIS — G459 Transient cerebral ischemic attack, unspecified: Secondary | ICD-10-CM

## 2019-11-29 DIAGNOSIS — H9190 Unspecified hearing loss, unspecified ear: Secondary | ICD-10-CM

## 2019-11-29 DIAGNOSIS — M1990 Unspecified osteoarthritis, unspecified site: Secondary | ICD-10-CM

## 2019-11-29 DIAGNOSIS — I341 Nonrheumatic mitral (valve) prolapse: Secondary | ICD-10-CM

## 2019-11-29 DIAGNOSIS — I499 Cardiac arrhythmia, unspecified: Secondary | ICD-10-CM

## 2019-11-29 DIAGNOSIS — K760 Fatty (change of) liver, not elsewhere classified: Secondary | ICD-10-CM

## 2019-11-29 DIAGNOSIS — R42 Dizziness and giddiness: Secondary | ICD-10-CM

## 2019-11-29 DIAGNOSIS — K929 Disease of digestive system, unspecified: Secondary | ICD-10-CM

## 2019-11-29 DIAGNOSIS — G43909 Migraine, unspecified, not intractable, without status migrainosus: Secondary | ICD-10-CM

## 2019-11-29 LAB — LIVER FUNCTION PANEL
Lab: 0.2 mg/dL (ref <0.4)
Lab: 1.5 mg/dL — ABNORMAL HIGH (ref 0.3–1.2)
Lab: 24 U/L (ref 7–40)
Lab: 25 U/L (ref 7–56)
Lab: 4.4 g/dL (ref 3.5–5.0)
Lab: 7.5 g/dL (ref 6.0–8.0)
Lab: 82 U/L (ref 25–110)

## 2019-11-29 NOTE — Progress Notes
Name: Alexis Lewis          MRN: [id number]      DOB: 1950-08-22      AGE: 69 y.o.   DATE OF SERVICE: 11/29/2019    Subjective:             Reason for Visit: Breast cancer annual medical oncology follow-up visit  Heme/Onc Care      Alexis Lewis is a 69 y.o. female.     Cancer Staging  Ductal carcinoma in situ (DCIS) of left breast  Staging form: Breast, AJCC 8th Edition  - Clinical stage from 12/30/2016: Stage 0 (cTis (DCIS), cN0, cM0, ER: Positive, PR: Positive, HER2: Not assessed ) - Signed by Alease Medina, PA-C on 12/30/2016  - Pathologic stage from 02/04/2017: Stage 0 (pTis (DCIS), pN0, cM0, ER: Positive, PR: Positive, HER2: Not assessed ) - Signed by Alease Medina, PA-C on 02/04/2017      History of Present Illness  Alexis Lewis is seen today in follow-up of her left breast DCIS which was diagnosed November 2018.  She is status post left lumpectomy with Dr. Leveda Anna followed by adjuvant radiotherapy completed in March 2019.  She initially tolerated tamoxifen well but did have some issues with insomnia and discontinued this in October 2019.  She saw Mik in follow-up in June 2020 and letrozole was recommended but she had some arthralgias with this which then resolved when she stopped the medication.    She will see Alease Medina and Dr. Leveda Anna in follow-up on 12/07/2019 with mammograms.    She follows with Dr. Wilford Grist in [place] and also sees Dr. Sandria Manly in cardiology at the [facility] clinic.    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    Oncology History:  ?  CHIEF COMPLAINT:?????  2.4 cm, pTis N0 M0?, Stage 0, ?Ductal Carcinoma in- Situ of the Left Breast.  Location: Left Breast, 6;00 Position, 4 cm From the Nipple.  Grade 2.  Patterrn: ?Solid and Solid Papillary.  Negative Margins.  ER+?(99%)  PR+?(96%)  Ki-67 index: ?18%  ?  PREVIOUS PROCEDURES / TREATMENTS:  12/10/16 ?Left Breast Core Biopsy [facility]).  01/28/17 ?Left Breast Lumpectomy ([facility]).  04/21/17 ?Completed Left Breast Hypofractionated Radiation to 5205 cGy.  07/10/17??Tamoxifen post Radiation per Dr. Willette Pa.  11/2017: Discontinue tamoxifen  June/2020 initiated letrozole but tolerated this just for a few weeks.  ?  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  Past surgical history:  has a past surgical history that includes ovarian cyst removal (1990's); colonoscopy; tonsillectomy (1988); appendectomy (0865); thyroidectomy (2015); hysterectomy (1990's); and Mastectomy, partial (Left, 01/28/2017) (LEFT RADIOACTIVE SEED LOCALIZED LUMPECTOMY performed by Stanton Kidney, MD at [facility] OR/PERIOP).  Family history:  family history includes Arthritis-rheumatoid in her father; Diabetes in her maternal grandfather, maternal grandmother, mother, and sister; Heart Disease in her father; High Cholesterol in her father and mother; Hypertension in her mother and sister; Migraines in her mother; Rashes/Skin Problems in her father and sister.  Social history:  reports that she has never smoked. She has never used  smokeless tobacco. She reports that she does not drink alcohol or use drugs.  ?     Review of Systems  Mild fatigue but no other complaints besides some urinary issues.  She is on chronic Macrodantin.  Constitutional: Negative for fever and chills.   HENT: Negative for ear pain, sore throat, mouth sores, trouble swallowing, neck pain and sinus pressure.    Eyes: Negative for visual disturbance.   Respiratory: Negative for shortness of breath.    Cardiovascular: Negative for chest pain.   Gastrointestinal: Negative for nausea, diarrhea, constipation and blood in stool.   Genitourinary: Urinary incontinence and some dysuria at times.  Chronic bladder issues.  Musculoskeletal: Negative.    Neurological: Negative for weakness, numbness and headaches.   Hematological: Does not bruise/bleed easily.   Psychiatric/Behavioral: Negative for confusion and decreased concentration. The patient is not nervous/anxious.        Objective:         ? CALCIUM PO Take 1 tablet by mouth daily.   ? chlorthalidone (HYGROTON) 25 mg tablet Take 25 mg by mouth daily.   ? cholecalciferol (VITAMIN D-3) 50 mcg (2,000 unit) tablet Take 2,000 Units by mouth daily.   ? insulin aspart U-100 (NOVOLOG U-100 INSULIN ASPART) 100 unit/mL injection Inject 16 Units under the skin three times daily with meals. Plus 1-4 units sliding scale three times daily   ? LEVEMIR FLEXTOUCH U-100 INSULN 100 unit/mL (3 mL) injection pen Inject 45 Units under the skin daily.   ? levothyroxine (SYNTHROID) 125 mcg tablet Take 125 mcg by mouth daily 30 minutes before breakfast.   ? loperamide (IMODIUM A-D) 2 mg capsule Take 2 mg by mouth as Needed for Diarrhea. Take 2 capsules by mouth initially, followed by 1 capsule by mouth after each loose stool up to a maximum of 8 tablets in 24 hours.   ? losartan(+) (COZAAR) 100 mg tablet Take 100 mg by mouth daily.   ? multivit-min/ferrous fumarate (MULTI VITAMIN PO) Take 1 tablet by mouth daily.   ? nitrofurantoin macrocrystaL (MACRODANTIN) 50 mg capsule Take 50 mg by mouth daily.   ? omeprazole DR (PRILOSEC) 40 mg capsule Take 1 capsule by mouth daily.   ? turmeric 400 mg cap Take 1 capsule by mouth daily.     Vitals:    11/29/19 0804   BP: (!) 155/69   BP Source: Arm, Left Upper   Patient Position: Sitting   Pulse: 80   Resp: 16   Temp: 36.3 ?C (97.3 ?F)   TempSrc: Temporal   SpO2: 98%   Weight: 76.3 kg (168 lb 3.2 oz)   Height: 157.5 cm (62)   PainSc: Five     Body mass index is 30.76 kg/m?Marland Kitchen     Pain Score: Five       Fatigue Scale: 0-None    Pain Addressed:  N/A    Patient Evaluated for a Clinical Trial: Patient not eligible for a treatment trial (including not needing treatment, needs palliative care, in remission).     [place] Cooperative Oncology Group performance status is 0, Fully active, able to carry on all pre-disease performance without restriction.Marland Kitchen     Physical Exam     Very pleasant conversant well-appearing Caucasian female in no distress.  HEENT: No abnormalities noted.   Breasts: Right breast without mass or adenopathy.  Left breast with minimal well-healed postoperative changes.  Slight tenderness in left tail of breast region.  No palpable mass or adenopathy.  Extremities: No lymphedema.  Neuro: Alert and oriented x3.     Assessment and Plan:  Ductal carcinoma in situ of left breast  Status post lumpectomy followed by radiotherapy.  Initially tolerated tamoxifen but did discontinue this in October 2019.  Tried letrozole briefly in June 2020 but did not tolerate this.  She will see Dr. Leveda Anna and Alease Medina on 12/07/2019 with mammograms.  We will plan follow-up here in the fall 2022 with clinical examination.    This note is partially generated using voice recognition software.  Please excuse any typographical errors.

## 2019-12-07 ENCOUNTER — Encounter: Admit: 2019-12-07 | Discharge: 2019-12-07 | Payer: MEDICARE

## 2019-12-07 ENCOUNTER — Ambulatory Visit: Admit: 2019-12-07 | Discharge: 2019-12-07 | Payer: MEDICARE

## 2019-12-07 DIAGNOSIS — M549 Dorsalgia, unspecified: Secondary | ICD-10-CM

## 2019-12-07 DIAGNOSIS — E119 Type 2 diabetes mellitus without complications: Secondary | ICD-10-CM

## 2019-12-07 DIAGNOSIS — K760 Fatty (change of) liver, not elsewhere classified: Secondary | ICD-10-CM

## 2019-12-07 DIAGNOSIS — M810 Age-related osteoporosis without current pathological fracture: Secondary | ICD-10-CM

## 2019-12-07 DIAGNOSIS — K759 Inflammatory liver disease, unspecified: Secondary | ICD-10-CM

## 2019-12-07 DIAGNOSIS — K929 Disease of digestive system, unspecified: Secondary | ICD-10-CM

## 2019-12-07 DIAGNOSIS — M1990 Unspecified osteoarthritis, unspecified site: Secondary | ICD-10-CM

## 2019-12-07 DIAGNOSIS — G459 Transient cerebral ischemic attack, unspecified: Secondary | ICD-10-CM

## 2019-12-07 DIAGNOSIS — E8881 Metabolic syndrome: Secondary | ICD-10-CM

## 2019-12-07 DIAGNOSIS — G629 Polyneuropathy, unspecified: Secondary | ICD-10-CM

## 2019-12-07 DIAGNOSIS — H547 Unspecified visual loss: Secondary | ICD-10-CM

## 2019-12-07 DIAGNOSIS — N3010 Interstitial cystitis (chronic) without hematuria: Secondary | ICD-10-CM

## 2019-12-07 DIAGNOSIS — E039 Hypothyroidism, unspecified: Secondary | ICD-10-CM

## 2019-12-07 DIAGNOSIS — M797 Fibromyalgia: Secondary | ICD-10-CM

## 2019-12-07 DIAGNOSIS — R42 Dizziness and giddiness: Secondary | ICD-10-CM

## 2019-12-07 DIAGNOSIS — D0512 Intraductal carcinoma in situ of left breast: Secondary | ICD-10-CM

## 2019-12-07 DIAGNOSIS — Z1231 Encounter for screening mammogram for malignant neoplasm of breast: Secondary | ICD-10-CM

## 2019-12-07 DIAGNOSIS — G4730 Sleep apnea, unspecified: Secondary | ICD-10-CM

## 2019-12-07 DIAGNOSIS — I499 Cardiac arrhythmia, unspecified: Secondary | ICD-10-CM

## 2019-12-07 DIAGNOSIS — G43909 Migraine, unspecified, not intractable, without status migrainosus: Secondary | ICD-10-CM

## 2019-12-07 DIAGNOSIS — I341 Nonrheumatic mitral (valve) prolapse: Secondary | ICD-10-CM

## 2019-12-07 DIAGNOSIS — H9190 Unspecified hearing loss, unspecified ear: Secondary | ICD-10-CM

## 2019-12-07 DIAGNOSIS — I10 Essential (primary) hypertension: Secondary | ICD-10-CM

## 2019-12-07 NOTE — Progress Notes
B mmg is benign   Patient notified via [facility]  Follow up as planned   copy forwarded to Dr Willette Pa, Alona Bene

## 2019-12-07 NOTE — Progress Notes
Name: Alexis Lewis          MRN: [id number]      DOB: [birth year]      AGE: 69 y.o.   DATE OF SERVICE: 12/07/2019           Reason for Visit:  Heme/Onc Care      Alexis Lewis is a 69 y.o. female.     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 She is doing well today and has no focal breast or systemic concerns.     HISTORY:  Alexis Lewis is a female who presented to the [facility] on 12/30/2016 at age 62 for left breast cancer. She presented for screening mammogram 05/11/2016 and was told there was an abnormality and 6 month follow up with recommended.  She returned for diagnostic mammogram and ultrasound 12/04/2016 which confirmed mass and biopsy was recommended.  She underwent sono-guided biopsy on 12/10/2016 which revealed grade 2 DCIS.  She proceeded with Left rsl lumpectomy on 01/28/2017.  Final surgical pathology revealed grade 2 DCIS measuring 2.4 cm with clear margins. She completed adjuvant radiation therapy on 04/21/2017.  She started Tamoxifen 07/2017.     BREAST IMAGING:  Mammogram:    - Screening mammogram ([place]) revealed a moderate heterogenous fibroglandular parenchymal pattern.  No dominant mass lesion or architectural change.  Several benign calcifications were identified with no evidence of new suspicious aggregate pleomorphic microcalcifications.   - Left diagnostic mammogram 05/28/2016 Alexis Lewis) revealed compression magnification views reveal no definite persistent concerning density. As a precaution repeat left mammogram in 6 months was recommended.   - Left diagnostic mammogram 12/04/2016 ([place]) revealed a lobulated ovoid same mass in the left breast 4 cm FTN at the 6:00 position.  - Bilateral diagnostic mammogram 12/30/2016 ([place]) revealed both breast demonstrate scattered fibroglandular densities. The right breast appears normal. At 6:00 in the left breast approximately 4-5 cm from nipple there was a small hematoma and a clip marker from recent biopsy.  Ultrasound:   - Left breast ultrasound 12/04/2016 [facility]) revealed a hypoechoic irregular mass with mild vascularity on color doppler imaging at 6:00 position 3 cm FTN.  There was posterior shadowing. No abnormal lymphadenopathy.   - Left Targeted ultrasound 12/30/2016 ([place]) revealed at this location there was a small mass measuring approximately 8 mm x 5 mm x 7 mm with associated hematoma. The clip marker seen adjacent to the small mass. The findings were consistent with recently diagnosed malignancy. No other satellite lesions or suspicious findings were present.    REPRODUCTIVE HEALTH:  Age at first Menarche:  3  Age at First Live Birth:  48  Age at Menopause:  35  Gravida:  4  Para: 3  Breastfeeding:  no    PROCEDURE: Left rsl lumpectomy 01/28/2017 Alexis Lewis)  PERTINENT PMH:  Fibromyalgia, colon polyps, osteoporosis, TIA-1980s, thyroidectomy, Mitral valve/tricuspid prolapse, RBBB, DM type  2, HLP  FAMILY HISTORY:  Maternal cousin - breast cancer (66). No family history of ovarian cancer   MEDICAL ONCOLOGY:    Alexis Lewis   Present Therapy: Tamoxifen started 07/10/2017, stopped. Given script for Femara but has not started  REFERRED BY:  Alexis Lewis          Review of Systems  Review of 13 systems negative except for:   Abdominal pain, back pain    Allergies   Allergen Reactions   ? Statins-Hmg-Coa Reductase Inhibitors MUSCLE PAIN   ? Adhesive RASH and ITCHING   ? Betadine [Povidone-Iodine] RASH     Reaction from scrub they used for surgery   ? Ciprofloxacin MUSCLE PAIN   ? Latex RASH     The following medical/surgical/family/social history and the list of medications are current, as of 12/07/2019    Medical History:   Diagnosis Date   ? Acquired hypothyroidism    ? Arthritis    ? Back pain    ? DM (diabetes mellitus), type 2 (HCC)    ? Fatty liver    ? Fibromyalgia    ? Fibromyalgia    ? Gastrointestinal disorder     colon polyps   ? Hearing reduced    ? Hepatitis 1960's    Hep A   ? HTN (hypertension)    ? Interstitial cystitis    ? Irregular heart beat     ST and BBB   ? Metabolic syndrome    ? Migraines    ? Mitral valve prolapse    ? Neuropathy    ? Osteoporosis    ? Sleep apnea     doesn't have CPAP machine   ? TIA (transient ischemic attack) [phone]    TIA's - pt states multiple    ? Vertigo    ? Vision decreased      Surgical History:   Procedure Laterality Date   ? HX APPENDECTOMY  1981   ? HX TONSILLECTOMY  1988   ? THYROIDECTOMY  2015   ? LEFT RADIOACTIVE SEED LOCALIZED LUMPECTOMY Left 01/28/2017    Performed by Alexis Kidney, MD at [facility] OR   ? COLONOSCOPY     ? HX HYSTERECTOMY  1990's   ? OVARIAN CYST REMOVAL  1990's     Family History   Problem Relation Age of Onset   ? Hypertension Mother    ? High Cholesterol Mother    ? Diabetes Mother    ? Migraines Mother    ? Heart Disease Father    ? High Cholesterol Father    ? Arthritis-rheumatoid Father    ? Rashes/Skin Problems Father    ? Heart Attack Father 100   ? Arthritis Father    ? Diabetes Sister    ? Hypertension Sister    ? Rashes/Skin Problems Sister    ? Diabetes Maternal Grandmother    ? Diabetes Maternal Grandfather      Social History     Socioeconomic History   ? Marital status: Married     Spouse name: Not on file   ? Number of children: Not on file   ? Years of education: Not on file   ? Highest education level: Not on file   Occupational History   ? Not on file   Tobacco Use   ? Smoking status: Never Smoker   ? Smokeless tobacco: Never Used   Substance and Sexual Activity   ? Alcohol use: No   ? Drug  use: No   ? Sexual activity: Not on file   Other Topics Concern   ? Not on file   Social History Narrative   ? Not on file         Objective:         ? CALCIUM PO Take 1 tablet by mouth daily.   ? chlorthalidone (HYGROTON) 25 mg tablet Take 25 mg by mouth daily.   ? cholecalciferol (VITAMIN D-3) 50 mcg (2,000 unit) tablet Take 2,000 Units by mouth daily.   ? insulin aspart U-100 (NOVOLOG U-100 INSULIN ASPART) 100 unit/mL injection Inject 16 Units under the skin three times daily with meals. Plus 1-4 units sliding scale three times daily   ? LEVEMIR FLEXTOUCH U-100 INSULN 100 unit/mL (3 mL) injection pen Inject 45 Units under the skin daily.   ? levothyroxine (SYNTHROID) 125 mcg tablet Take 125 mcg by mouth daily 30 minutes before breakfast.   ? loperamide (IMODIUM A-D) 2 mg capsule Take 2 mg by mouth as Needed for Diarrhea. Take 2 capsules by mouth initially, followed by 1 capsule by mouth after each loose stool up to a maximum of 8 tablets in 24 hours.   ? losartan(+) (COZAAR) 100 mg tablet Take 100 mg by mouth daily.   ? multivit-min/ferrous fumarate (MULTI VITAMIN PO) Take 1 tablet by mouth daily.   ? nitrofurantoin macrocrystaL (MACRODANTIN) 50 mg capsule Take 50 mg by mouth daily.   ? omeprazole DR (PRILOSEC) 40 mg capsule Take 1 capsule by mouth daily.   ? turmeric 400 mg cap Take 1 capsule by mouth daily.     Vitals:    12/07/19 1336   BP: (!) 150/79   BP Source: Arm, Left Upper   Patient Position: Sitting   Pulse: 79   Resp: 18   Temp: 36.5 ?C (97.7 ?F)   TempSrc: Temporal   SpO2: 98%   Weight: 76.2 kg (168 lb)   Height: 157.5 cm (62.01)   PainSc: Three     Body mass index is 30.72 kg/m?Marland Kitchen     Pain Score: Three  Pain Loc: Abdomen (through the back )    Fatigue Scale: 0-None    Pain Addressed:  N/A    Patient Evaluated for a Clinical Trial: No treatment clinical trial available for this patient.     [place] Cooperative Oncology Group performance status is 0, Fully active, able to carry on all pre-disease performance without restriction.Marland Kitchen     Physical Exam     RIGHT BREAST EXAM:  Breast:  No palpable breast masses. No skin, nipple, or areolar change.  Skin Erythema:  No  Attachment of Overlying Skin:  No  Peau d' orange:  No  Chest Wall Attachment:  No  Nipple Inversion:  No  Nipple Discharge: No    LEFT BREAST EXAM:  Breast: No palpable breast masses. No skin, nipple, or areolar change.  Skin Erythema:  No  Attachment of Overlying Skin:  No  Peau d' orange:  No  Chest Wall Attachment: No  Nipple Inversion:  No  Nipple Discharge:  No    RIGHT NODAL BASIN EXAM:  Axillary:  negative  Infraclavicular:  negative  Supraclavicular:  negative    LEFT NODAL BASIN EXAM:  Axillary:  negative  Infraclavicular: negative  Supraclavicular:  negative      Constitutional: No acute distress.  HEENT:  Head: Normocephalic and atraumatic.  Eyes: No discharge. No scleral icterus.  Pulmonary/Chest: No respiratory distress.   Neurological: Alert and oriented  to person, place and time. No cranial nerve deficit.  Skin: Warm and dry. No rash noted. No erythema. No pallor.  Psychiatric: Normal mood and affect. Behavior is normal. Judgement and thought content normal.       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 Duckett presents to clinic today for routine follow up. She has no evidence of local recurrence. She completed screening mammogram today, I will contact her with the results when they are available.  She continues to follow with Alexis Lewis for medical oncology.  We discussed follow up planning, she will no longer follow in breast surgery clinic.   She should continue to have screening mammogram yearly.  She was given ample time to ask questions all of which were answered to her satisfaction. She was encouraged to call with any interval questions or concerns.     1. Continue following with Alexis Lewis  2. Screening mammogram in 1 year  3. Return to clinic prn    Nigel Berthold, Lewis-C

## 2020-01-09 ENCOUNTER — Encounter: Admit: 2020-01-09 | Discharge: 2020-01-09 | Payer: MEDICARE

## 2020-01-09 DIAGNOSIS — I10 Essential (primary) hypertension: Secondary | ICD-10-CM

## 2020-01-09 DIAGNOSIS — M810 Age-related osteoporosis without current pathological fracture: Secondary | ICD-10-CM

## 2020-01-09 DIAGNOSIS — I341 Nonrheumatic mitral (valve) prolapse: Secondary | ICD-10-CM

## 2020-01-09 DIAGNOSIS — G43909 Migraine, unspecified, not intractable, without status migrainosus: Secondary | ICD-10-CM

## 2020-01-09 DIAGNOSIS — G629 Polyneuropathy, unspecified: Secondary | ICD-10-CM

## 2020-01-09 DIAGNOSIS — I499 Cardiac arrhythmia, unspecified: Secondary | ICD-10-CM

## 2020-01-09 DIAGNOSIS — M1990 Unspecified osteoarthritis, unspecified site: Secondary | ICD-10-CM

## 2020-01-09 DIAGNOSIS — K929 Disease of digestive system, unspecified: Secondary | ICD-10-CM

## 2020-01-09 DIAGNOSIS — R42 Dizziness and giddiness: Secondary | ICD-10-CM

## 2020-01-09 DIAGNOSIS — M797 Fibromyalgia: Secondary | ICD-10-CM

## 2020-01-09 DIAGNOSIS — E119 Type 2 diabetes mellitus without complications: Secondary | ICD-10-CM

## 2020-01-09 DIAGNOSIS — K759 Inflammatory liver disease, unspecified: Secondary | ICD-10-CM

## 2020-01-09 DIAGNOSIS — G4730 Sleep apnea, unspecified: Secondary | ICD-10-CM

## 2020-01-09 DIAGNOSIS — H547 Unspecified visual loss: Secondary | ICD-10-CM

## 2020-01-09 DIAGNOSIS — N3010 Interstitial cystitis (chronic) without hematuria: Secondary | ICD-10-CM

## 2020-01-09 DIAGNOSIS — E039 Hypothyroidism, unspecified: Secondary | ICD-10-CM

## 2020-01-09 DIAGNOSIS — K760 Fatty (change of) liver, not elsewhere classified: Secondary | ICD-10-CM

## 2020-01-09 DIAGNOSIS — M549 Dorsalgia, unspecified: Secondary | ICD-10-CM

## 2020-01-09 DIAGNOSIS — E8881 Metabolic syndrome: Secondary | ICD-10-CM

## 2020-01-09 DIAGNOSIS — G459 Transient cerebral ischemic attack, unspecified: Secondary | ICD-10-CM

## 2020-01-09 DIAGNOSIS — H9190 Unspecified hearing loss, unspecified ear: Secondary | ICD-10-CM

## 2020-04-22 ENCOUNTER — Encounter: Admit: 2020-04-22 | Discharge: 2020-04-22 | Payer: MEDICARE

## 2020-07-17 ENCOUNTER — Encounter: Admit: 2020-07-17 | Discharge: 2020-07-17 | Payer: MEDICARE

## 2020-07-17 NOTE — Progress Notes
Contacted patient by phone on 07/17/2020 regarding lab order for Lipid Panel ordered per Dr. Arlester Marker Love that has not been completed. Requested patient have this drawn before next scheduled appointment with [facility] on 07/30/2020. Patient requested lab orders be faxed to [facility] ([place]: [phone]). Faxed lab orders per patient's request. Notified patient of fasting instructions for lab draw. Patient voiced verbal understanding and will have lab drawn before scheduled appointment with [facility] on 07/30/2020.

## 2020-07-25 ENCOUNTER — Encounter: Admit: 2020-07-25 | Discharge: 2020-07-25 | Payer: MEDICARE

## 2020-07-25 DIAGNOSIS — I499 Cardiac arrhythmia, unspecified: Secondary | ICD-10-CM

## 2020-07-25 DIAGNOSIS — I10 Essential (primary) hypertension: Secondary | ICD-10-CM

## 2020-07-25 LAB — LIPID PROFILE
CHOLESTEROL/HDL %: 6 — ABNORMAL HIGH (ref 0–5)
CHOLESTEROL: 234 — ABNORMAL HIGH (ref <200)
HDL: 40
LDL: 134 — ABNORMAL HIGH (ref <100)
TRIGLYCERIDES: 298 — ABNORMAL HIGH (ref <150)
VLDL: 60 — ABNORMAL HIGH (ref 5–40)

## 2020-07-30 ENCOUNTER — Encounter: Admit: 2020-07-30 | Discharge: 2020-07-30 | Payer: MEDICARE

## 2020-07-30 DIAGNOSIS — I499 Cardiac arrhythmia, unspecified: Secondary | ICD-10-CM

## 2020-07-30 DIAGNOSIS — I10 Essential (primary) hypertension: Secondary | ICD-10-CM

## 2020-07-30 DIAGNOSIS — M797 Fibromyalgia: Secondary | ICD-10-CM

## 2020-07-30 DIAGNOSIS — N3010 Interstitial cystitis (chronic) without hematuria: Secondary | ICD-10-CM

## 2020-07-30 DIAGNOSIS — G4730 Sleep apnea, unspecified: Secondary | ICD-10-CM

## 2020-07-30 DIAGNOSIS — H9190 Unspecified hearing loss, unspecified ear: Secondary | ICD-10-CM

## 2020-07-30 DIAGNOSIS — E8881 Metabolic syndrome: Secondary | ICD-10-CM

## 2020-07-30 DIAGNOSIS — I341 Nonrheumatic mitral (valve) prolapse: Secondary | ICD-10-CM

## 2020-07-30 DIAGNOSIS — R42 Dizziness and giddiness: Secondary | ICD-10-CM

## 2020-07-30 DIAGNOSIS — K759 Inflammatory liver disease, unspecified: Secondary | ICD-10-CM

## 2020-07-30 DIAGNOSIS — H547 Unspecified visual loss: Secondary | ICD-10-CM

## 2020-07-30 DIAGNOSIS — E039 Hypothyroidism, unspecified: Secondary | ICD-10-CM

## 2020-07-30 DIAGNOSIS — K760 Fatty (change of) liver, not elsewhere classified: Secondary | ICD-10-CM

## 2020-07-30 DIAGNOSIS — M810 Age-related osteoporosis without current pathological fracture: Secondary | ICD-10-CM

## 2020-07-30 DIAGNOSIS — G43909 Migraine, unspecified, not intractable, without status migrainosus: Secondary | ICD-10-CM

## 2020-07-30 DIAGNOSIS — M1990 Unspecified osteoarthritis, unspecified site: Secondary | ICD-10-CM

## 2020-07-30 DIAGNOSIS — R0602 Shortness of breath: Secondary | ICD-10-CM

## 2020-07-30 DIAGNOSIS — G459 Transient cerebral ischemic attack, unspecified: Secondary | ICD-10-CM

## 2020-07-30 DIAGNOSIS — M549 Dorsalgia, unspecified: Secondary | ICD-10-CM

## 2020-07-30 DIAGNOSIS — K929 Disease of digestive system, unspecified: Secondary | ICD-10-CM

## 2020-07-30 DIAGNOSIS — E119 Type 2 diabetes mellitus without complications: Secondary | ICD-10-CM

## 2020-07-30 DIAGNOSIS — G629 Polyneuropathy, unspecified: Secondary | ICD-10-CM

## 2020-08-01 ENCOUNTER — Ambulatory Visit: Admit: 2020-08-01 | Discharge: 2020-08-01 | Payer: MEDICARE

## 2020-08-01 DIAGNOSIS — I499 Cardiac arrhythmia, unspecified: Secondary | ICD-10-CM

## 2020-08-01 DIAGNOSIS — G459 Transient cerebral ischemic attack, unspecified: Secondary | ICD-10-CM

## 2020-08-01 DIAGNOSIS — I10 Essential (primary) hypertension: Secondary | ICD-10-CM

## 2020-08-01 DIAGNOSIS — R0602 Shortness of breath: Secondary | ICD-10-CM

## 2020-08-02 ENCOUNTER — Encounter: Admit: 2020-08-02 | Discharge: 2020-08-02 | Payer: MEDICARE

## 2020-08-02 NOTE — Telephone Encounter
Called and discussed results with patient.  No questions at this time.  Pt will callback with any questions, concerns or problems.

## 2020-08-02 NOTE — Telephone Encounter
-----   Message from Benna Dunks, MD sent at 08/02/2020  2:36 PM CDT -----  Hey guys,    Would you mind reaching out to [facility] and letting her know that her stress test looked great.  Nothing to suggest blockages in her heart arteries as the cause of her chest discomfort.  Normal heart pump function.    Thanks!

## 2020-10-15 ENCOUNTER — Encounter: Admit: 2020-10-15 | Discharge: 2020-10-15 | Payer: MEDICARE

## 2020-10-15 ENCOUNTER — Ambulatory Visit: Admit: 2020-10-15 | Discharge: 2020-10-15 | Payer: MEDICARE

## 2020-10-15 DIAGNOSIS — G459 Transient cerebral ischemic attack, unspecified: Secondary | ICD-10-CM

## 2020-10-15 DIAGNOSIS — D0512 Intraductal carcinoma in situ of left breast: Secondary | ICD-10-CM

## 2020-10-15 DIAGNOSIS — G629 Polyneuropathy, unspecified: Secondary | ICD-10-CM

## 2020-10-15 DIAGNOSIS — K929 Disease of digestive system, unspecified: Secondary | ICD-10-CM

## 2020-10-15 DIAGNOSIS — E039 Hypothyroidism, unspecified: Secondary | ICD-10-CM

## 2020-10-15 DIAGNOSIS — M810 Age-related osteoporosis without current pathological fracture: Secondary | ICD-10-CM

## 2020-10-15 DIAGNOSIS — N183 Chronic kidney disease, stage 3 unspecified (HCC): Secondary | ICD-10-CM

## 2020-10-15 DIAGNOSIS — K760 Fatty (change of) liver, not elsewhere classified: Secondary | ICD-10-CM

## 2020-10-15 DIAGNOSIS — H547 Unspecified visual loss: Secondary | ICD-10-CM

## 2020-10-15 DIAGNOSIS — G43909 Migraine, unspecified, not intractable, without status migrainosus: Secondary | ICD-10-CM

## 2020-10-15 DIAGNOSIS — I10 Essential (primary) hypertension: Secondary | ICD-10-CM

## 2020-10-15 DIAGNOSIS — M549 Dorsalgia, unspecified: Secondary | ICD-10-CM

## 2020-10-15 DIAGNOSIS — G4730 Sleep apnea, unspecified: Secondary | ICD-10-CM

## 2020-10-15 DIAGNOSIS — N3010 Interstitial cystitis (chronic) without hematuria: Secondary | ICD-10-CM

## 2020-10-15 DIAGNOSIS — I341 Nonrheumatic mitral (valve) prolapse: Secondary | ICD-10-CM

## 2020-10-15 DIAGNOSIS — E119 Type 2 diabetes mellitus without complications: Secondary | ICD-10-CM

## 2020-10-15 DIAGNOSIS — E8881 Metabolic syndrome: Secondary | ICD-10-CM

## 2020-10-15 DIAGNOSIS — R42 Dizziness and giddiness: Secondary | ICD-10-CM

## 2020-10-15 DIAGNOSIS — M1990 Unspecified osteoarthritis, unspecified site: Secondary | ICD-10-CM

## 2020-10-15 DIAGNOSIS — K759 Inflammatory liver disease, unspecified: Secondary | ICD-10-CM

## 2020-10-15 DIAGNOSIS — H9190 Unspecified hearing loss, unspecified ear: Secondary | ICD-10-CM

## 2020-10-15 DIAGNOSIS — E1121 Type 2 diabetes mellitus with diabetic nephropathy: Secondary | ICD-10-CM

## 2020-10-15 DIAGNOSIS — I499 Cardiac arrhythmia, unspecified: Secondary | ICD-10-CM

## 2020-10-15 DIAGNOSIS — M797 Fibromyalgia: Secondary | ICD-10-CM

## 2020-10-15 LAB — PROTEIN/CR RATIO,UR RAN
PROT CREAT RAT/CAL: 0.3 — ABNORMAL HIGH (ref <0.15)
UR CREATININE, RAN: 36 mg/dL
UR TOTAL PROTEIN,RAN: 10 mg/dL

## 2020-10-15 MED ORDER — DAPAGLIFLOZIN 10 MG PO TAB
10 mg | ORAL_TABLET | Freq: Every day | ORAL | 3 refills | Status: AC
Start: 2020-10-15 — End: ?

## 2020-12-02 ENCOUNTER — Encounter: Admit: 2020-12-02 | Discharge: 2020-12-02 | Payer: MEDICARE

## 2020-12-02 DIAGNOSIS — I10 Essential (primary) hypertension: Secondary | ICD-10-CM

## 2020-12-02 DIAGNOSIS — M797 Fibromyalgia: Secondary | ICD-10-CM

## 2020-12-02 DIAGNOSIS — R42 Dizziness and giddiness: Secondary | ICD-10-CM

## 2020-12-02 DIAGNOSIS — G629 Polyneuropathy, unspecified: Secondary | ICD-10-CM

## 2020-12-02 DIAGNOSIS — K760 Fatty (change of) liver, not elsewhere classified: Secondary | ICD-10-CM

## 2020-12-02 DIAGNOSIS — G43909 Migraine, unspecified, not intractable, without status migrainosus: Secondary | ICD-10-CM

## 2020-12-02 DIAGNOSIS — M1990 Unspecified osteoarthritis, unspecified site: Secondary | ICD-10-CM

## 2020-12-02 DIAGNOSIS — G4730 Sleep apnea, unspecified: Secondary | ICD-10-CM

## 2020-12-02 DIAGNOSIS — I499 Cardiac arrhythmia, unspecified: Secondary | ICD-10-CM

## 2020-12-02 DIAGNOSIS — K929 Disease of digestive system, unspecified: Secondary | ICD-10-CM

## 2020-12-02 DIAGNOSIS — N3010 Interstitial cystitis (chronic) without hematuria: Secondary | ICD-10-CM

## 2020-12-02 DIAGNOSIS — G459 Transient cerebral ischemic attack, unspecified: Secondary | ICD-10-CM

## 2020-12-02 DIAGNOSIS — H547 Unspecified visual loss: Secondary | ICD-10-CM

## 2020-12-02 DIAGNOSIS — E039 Hypothyroidism, unspecified: Secondary | ICD-10-CM

## 2020-12-02 DIAGNOSIS — Z1231 Encounter for screening mammogram for malignant neoplasm of breast: Secondary | ICD-10-CM

## 2020-12-02 DIAGNOSIS — M549 Dorsalgia, unspecified: Secondary | ICD-10-CM

## 2020-12-02 DIAGNOSIS — K759 Inflammatory liver disease, unspecified: Secondary | ICD-10-CM

## 2020-12-02 DIAGNOSIS — E119 Type 2 diabetes mellitus without complications: Secondary | ICD-10-CM

## 2020-12-02 DIAGNOSIS — D0512 Intraductal carcinoma in situ of left breast: Secondary | ICD-10-CM

## 2020-12-02 DIAGNOSIS — H9190 Unspecified hearing loss, unspecified ear: Secondary | ICD-10-CM

## 2020-12-02 DIAGNOSIS — M810 Age-related osteoporosis without current pathological fracture: Secondary | ICD-10-CM

## 2020-12-02 DIAGNOSIS — E8881 Metabolic syndrome: Secondary | ICD-10-CM

## 2020-12-02 DIAGNOSIS — I341 Nonrheumatic mitral (valve) prolapse: Secondary | ICD-10-CM

## 2020-12-02 NOTE — Telephone Encounter
Received page from [place] from [facility] requesting orders for diagnostic sonogram. Returned call and LVM to return call.

## 2020-12-02 NOTE — Progress Notes
Name: Alexis Lewis          MRN: [id number]      DOB: [birth year]      AGE: 70 y.o.   DATE OF SERVICE: 12/02/2020    Subjective:             Reason for Visit: Breast cancer annual medical oncology follow-up visit  Heme/Onc Care      Alexis Lewis is a 70 y.o. female.     Cancer Staging  Ductal carcinoma in situ (DCIS) of left breast  Staging form: Breast, AJCC 8th Edition  - Clinical stage from 12/30/2016: Stage 0 (cTis (DCIS), cN0, cM0, ER: Positive, PR: Positive, HER2: Not assessed ) - Signed by Alease Medina, PA-C on 12/30/2016  - Pathologic stage from 02/04/2017: Stage 0 (pTis (DCIS), pN0, cM0, ER: Positive, PR: Positive, HER2: Not assessed ) - Signed by Alease Medina, PA-C on 02/04/2017      History of Present Illness  Alexis Lewis is seen today in annual follow-up of her left breast DCIS which was diagnosed in November 2018.  She is status post left lumpectomy with Dr. Leveda Anna followed by adjuvant radiotherapy which completed in March 2019.  She initially tolerated tamoxifen well but did have some issues with insomnia and discontinued this in October 2019.  She saw Mik in follow-up in June 2020 and letrozole was recommended but she had some arthralgias with this which then resolved when she stopped the medication.    She last saw Alease Medina and Dr. Leveda Anna in follow-up on 12/07/2019 with mammograms.  She has graduated from their clinic and will see them as needed.    I am arranging 3D bilateral screening mammograms at [facility] for in the next few weeks.    Clinical breast examination today is benign.    She follows with Dr. Wilford Grist in [place] and also sees Dr. Sandria Manly in cardiology at the [facility] clinic.    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    Oncology History:  ?  CHIEF COMPLAINT:?????  2.4 cm, pTis N0 M0?, Stage 0, ?Ductal Carcinoma in- Situ of the Left Breast.  Location: Left Breast, 6;00 Position, 4 cm From the Nipple.  Grade 2.  Patterrn: ?Solid and Solid Papillary.  Negative Margins.  ER+?(99%)  PR+?(96%)  Ki-67 index: ?18%  ?  PREVIOUS PROCEDURES / TREATMENTS:  12/10/16 ?Left Breast Core Biopsy [facility]).  01/28/17 ?Left Breast Lumpectomy ([place]).  04/21/17 ?Completed Left Breast Hypofractionated Radiation to 5205 cGy.  07/10/17??Tamoxifen post Radiation per Dr. Willette Pa.  11/2017: Discontinue tamoxifen  June/2020 initiated letrozole but tolerated this just for a few weeks.  ?  Past medical history:  has a past medical history of Acquired hypothyroidism, Arthritis, Back pain, DM (diabetes mellitus), type 2 (HCC), Fatty liver, Fibromyalgia, Fibromyalgia, Gastrointestinal disorder (colon polyps), Hearing reduced, Hepatitis (1960's) (Hep A), HTN (hypertension), Interstitial cystitis, Irregular heart beat (ST and BBB), Metabolic syndrome, Migraines, Mitral valve prolapse, Neuropathy, Osteoporosis, Sleep apnea (doesn't have CPAP machine), TIA (transient ischemic attack) ([id number]) (TIA's - pt states multiple ), Vertigo, and Vision decreased.  Past surgical history:  has a past surgical history that includes ovarian cyst removal (1990's); colonoscopy; tonsillectomy (1988); appendectomy (2025); thyroidectomy (2015); hysterectomy (1990's); and Mastectomy, partial (Left, 01/28/2017) (LEFT RADIOACTIVE SEED LOCALIZED LUMPECTOMY performed by Stanton Kidney, MD at [facility] OR/[facility]).  Family history:  family history includes Arthritis-rheumatoid in her father; Diabetes in her maternal grandfather, maternal grandmother, mother,  and sister; Heart Disease in her father; High Cholesterol in her father and mother; Hypertension in her mother and sister; Migraines in her mother; Rashes/Skin Problems in her father and sister.  Social history:  reports that she has never smoked. She has never used smokeless tobacco. She reports that she does not drink alcohol or use drugs.  ?     Review of Systems  Mild fatigue which is chronic and some arthralgias and muscle spasms.  Some dizziness at times.  Constitutional: Negative for fever and chills.   HENT: Negative for ear pain, sore throat, mouth sores, trouble swallowing, neck pain and sinus pressure.    Eyes: Negative for visual disturbance.   Respiratory: Negative for shortness of breath.    Cardiovascular: Negative for chest pain.   Gastrointestinal: Negative for nausea, diarrhea, constipation and blood in stool.   Genitourinary: Urinary incontinence and some dysuria at times.  Chronic bladder issues.  Chronic urinary incontinence.  Musculoskeletal: Positive for joint pain back pain muscle spasms neck pain and neck stiffness.  Neurological: Negative for  and headaches. Dizziness numbness and weakness of legs at times.  Hematological: Does not bruise/bleed easily.   Psychiatric/Behavioral: Negative for confusion and decreased concentration. The patient is not nervous/anxious.        Objective:         ? CALCIUM PO Take 1 tablet by mouth daily.   ? cholecalciferol (VITAMIN D3) 50 mcg (2,000 unit) tablet Take 2,000 Units by mouth every 48 hours.   ? cyanocobalamin 1,000 mcg tablet Take 1,000 mcg by mouth daily.   ? dapagliflozin (FARXIGA) 10 mg tablet Take one tablet by mouth daily.   ? ginger root (GINGER EXTRACT PO) Take  by mouth.   ? insulin aspart U-100 (NOVOLOG) 100 unit/mL injection Inject 16 Units under the skin three times daily with meals. Plus 1-4 units sliding scale three times daily   ? LEVEMIR FLEXTOUCH U-100 INSULN 100 unit/mL (3 mL) injection pen Inject 45 Units under the skin daily.   ? levothyroxine (SYNTHROID) 125 mcg tablet Take 125 mcg by mouth daily 30 minutes before breakfast.   ? losartan(+) (COZAAR) 100 mg tablet Take 100 mg by mouth daily.   ? nitrofurantoin macrocrystaL (MACRODANTIN) 50 mg capsule Take 50 mg by mouth every 48 hours.     Vitals:    12/02/20 1045   BP: (!) 168/83   BP Source: Arm, Left Upper   Pulse: 86   Temp: 36.5 ?C (97.7 ?F)   Resp: 20   SpO2: 98%   TempSrc: Temporal   PainSc: Zero Weight: 77.6 kg (171 lb 1.6 oz)     Body mass index is 31.29 kg/m?Marland Kitchen     Pain Score: Zero       Fatigue Scale: 0-None    Pain Addressed:  N/A    Patient Evaluated for a Clinical Trial: Patient not eligible for a treatment trial (including not needing treatment, needs palliative care, in remission).     [place] Cooperative Oncology Group performance status is 0, Fully active, able to carry on all pre-disease performance without restriction.Marland Kitchen     Physical Exam     Very pleasant conversant well-appearing Caucasian female in no distress.  HEENT: No abnormalities noted.   Breasts: Right breast without mass or adenopathy.  Left breast with minimal well-healed postoperative changes.  Slight tenderness in left breast lower outer quadrant.  Minimal postoperative changes.  No palpable mass or adenopathy.  Extremities: No lymphedema.  Neuro: Alert and oriented  x3.     Assessment and Plan:  Ductal carcinoma in situ of left breast  Status post lumpectomy followed by radiotherapy.  Initially tolerated adjuvant endocrine therapy with tamoxifen but did discontinue this in October 2019.  Tried letrozole briefly in June 2020 but did not tolerate this.  She will be scheduled for 3D screening mammograms at [facility] in the next few weeks.  We will plan follow-up in our survivorship clinic at our [facility] location with [facility], APRN in 1 year.    This note is partially generated using voice recognition software.  Please excuse any typographical errors.

## 2021-01-03 ENCOUNTER — Encounter: Admit: 2021-01-03 | Discharge: 2021-01-03 | Payer: MEDICARE

## 2021-03-26 ENCOUNTER — Encounter: Admit: 2021-03-26 | Discharge: 2021-03-26 | Payer: MEDICARE

## 2021-04-01 ENCOUNTER — Ambulatory Visit: Admit: 2021-04-01 | Discharge: 2021-04-02 | Payer: MEDICARE

## 2021-04-01 ENCOUNTER — Encounter: Admit: 2021-04-01 | Discharge: 2021-04-01 | Payer: MEDICARE

## 2021-04-01 DIAGNOSIS — G4730 Sleep apnea, unspecified: Secondary | ICD-10-CM

## 2021-04-01 DIAGNOSIS — I499 Cardiac arrhythmia, unspecified: Secondary | ICD-10-CM

## 2021-04-01 DIAGNOSIS — I341 Nonrheumatic mitral (valve) prolapse: Secondary | ICD-10-CM

## 2021-04-01 DIAGNOSIS — E119 Type 2 diabetes mellitus without complications: Secondary | ICD-10-CM

## 2021-04-01 DIAGNOSIS — N3010 Interstitial cystitis (chronic) without hematuria: Secondary | ICD-10-CM

## 2021-04-01 DIAGNOSIS — E039 Hypothyroidism, unspecified: Secondary | ICD-10-CM

## 2021-04-01 DIAGNOSIS — M810 Age-related osteoporosis without current pathological fracture: Secondary | ICD-10-CM

## 2021-04-01 DIAGNOSIS — E8881 Metabolic syndrome: Secondary | ICD-10-CM

## 2021-04-01 DIAGNOSIS — N1831 Stage 3a chronic kidney disease (HCC): Secondary | ICD-10-CM

## 2021-04-01 DIAGNOSIS — E1121 Type 2 diabetes mellitus with diabetic nephropathy: Secondary | ICD-10-CM

## 2021-04-01 DIAGNOSIS — K760 Fatty (change of) liver, not elsewhere classified: Secondary | ICD-10-CM

## 2021-04-01 DIAGNOSIS — I10 Essential (primary) hypertension: Secondary | ICD-10-CM

## 2021-04-01 DIAGNOSIS — R42 Dizziness and giddiness: Secondary | ICD-10-CM

## 2021-04-01 DIAGNOSIS — G43909 Migraine, unspecified, not intractable, without status migrainosus: Secondary | ICD-10-CM

## 2021-04-01 DIAGNOSIS — M549 Dorsalgia, unspecified: Secondary | ICD-10-CM

## 2021-04-01 DIAGNOSIS — G459 Transient cerebral ischemic attack, unspecified: Secondary | ICD-10-CM

## 2021-04-01 DIAGNOSIS — M797 Fibromyalgia: Secondary | ICD-10-CM

## 2021-04-01 DIAGNOSIS — G629 Polyneuropathy, unspecified: Secondary | ICD-10-CM

## 2021-04-01 DIAGNOSIS — K929 Disease of digestive system, unspecified: Secondary | ICD-10-CM

## 2021-04-01 DIAGNOSIS — H547 Unspecified visual loss: Secondary | ICD-10-CM

## 2021-04-01 DIAGNOSIS — K759 Inflammatory liver disease, unspecified: Secondary | ICD-10-CM

## 2021-04-01 DIAGNOSIS — M1990 Unspecified osteoarthritis, unspecified site: Secondary | ICD-10-CM

## 2021-04-01 DIAGNOSIS — H9190 Unspecified hearing loss, unspecified ear: Secondary | ICD-10-CM

## 2021-04-01 NOTE — Patient Instructions
Please do not hesitate to call with any questions.  My nurse Jarrett Soho can be reached [phone].   Start taking amlodipine 10mg  daily    Please go to the lab in [place]    Return to clinic in 1 year

## 2021-07-28 ENCOUNTER — Encounter: Admit: 2021-07-28 | Discharge: 2021-07-28 | Payer: MEDICARE

## 2021-10-03 ENCOUNTER — Encounter: Admit: 2021-10-03 | Discharge: 2021-10-03 | Payer: MEDICARE

## 2021-12-09 ENCOUNTER — Encounter: Admit: 2021-12-09 | Discharge: 2021-12-09 | Payer: MEDICARE

## 2021-12-09 DIAGNOSIS — H547 Unspecified visual loss: Secondary | ICD-10-CM

## 2021-12-09 DIAGNOSIS — E119 Type 2 diabetes mellitus without complications: Secondary | ICD-10-CM

## 2021-12-09 DIAGNOSIS — D0512 Intraductal carcinoma in situ of left breast: Secondary | ICD-10-CM

## 2021-12-09 DIAGNOSIS — K759 Inflammatory liver disease, unspecified: Secondary | ICD-10-CM

## 2021-12-09 DIAGNOSIS — E8881 Metabolic syndrome: Secondary | ICD-10-CM

## 2021-12-09 DIAGNOSIS — K760 Fatty (change of) liver, not elsewhere classified: Secondary | ICD-10-CM

## 2021-12-09 DIAGNOSIS — G629 Polyneuropathy, unspecified: Secondary | ICD-10-CM

## 2021-12-09 DIAGNOSIS — N1831 Stage 3a chronic kidney disease (HCC): Secondary | ICD-10-CM

## 2021-12-09 DIAGNOSIS — E039 Hypothyroidism, unspecified: Secondary | ICD-10-CM

## 2021-12-09 DIAGNOSIS — G4730 Sleep apnea, unspecified: Secondary | ICD-10-CM

## 2021-12-09 DIAGNOSIS — I499 Cardiac arrhythmia, unspecified: Secondary | ICD-10-CM

## 2021-12-09 DIAGNOSIS — G43909 Migraine, unspecified, not intractable, without status migrainosus: Secondary | ICD-10-CM

## 2021-12-09 DIAGNOSIS — M797 Fibromyalgia: Secondary | ICD-10-CM

## 2021-12-09 DIAGNOSIS — G459 Transient cerebral ischemic attack, unspecified: Secondary | ICD-10-CM

## 2021-12-09 DIAGNOSIS — I341 Nonrheumatic mitral (valve) prolapse: Secondary | ICD-10-CM

## 2021-12-09 DIAGNOSIS — M1990 Unspecified osteoarthritis, unspecified site: Secondary | ICD-10-CM

## 2021-12-09 DIAGNOSIS — H9190 Unspecified hearing loss, unspecified ear: Secondary | ICD-10-CM

## 2021-12-09 DIAGNOSIS — K929 Disease of digestive system, unspecified: Secondary | ICD-10-CM

## 2021-12-09 DIAGNOSIS — R42 Dizziness and giddiness: Secondary | ICD-10-CM

## 2021-12-09 DIAGNOSIS — I10 Essential (primary) hypertension: Secondary | ICD-10-CM

## 2021-12-09 DIAGNOSIS — M549 Dorsalgia, unspecified: Secondary | ICD-10-CM

## 2021-12-09 DIAGNOSIS — M810 Age-related osteoporosis without current pathological fracture: Secondary | ICD-10-CM

## 2021-12-09 DIAGNOSIS — N3010 Interstitial cystitis (chronic) without hematuria: Secondary | ICD-10-CM

## 2021-12-09 NOTE — Assessment & Plan Note
[place] nephrology

## 2021-12-09 NOTE — Progress Notes
Date of Service: 12/09/2021               Reason for Visit:  Heme/Onc Care        Subjective:        History of Present Illness  Alexis Lewis is a 71 y.o. female is a patient here for hx of DCIS diagnosed in 2018.  She is currently on observation.   She denies any changes in health nor medications.  No changes in breast.  She is unaccompanied.      Mammogram scheduled for 01/13/22.      Oncology History:   Cancer Staging   Ductal carcinoma in situ (DCIS) of left breast  Staging form: Breast, AJCC 8th Edition  - Clinical stage from 12/30/2016: Stage 0 (cTis (DCIS), cN0, cM0, ER: Positive, PR: Positive, HER2: Not assessed ) - Signed by Alease Medina, PA-C on 12/30/2016  - Pathologic stage from 02/04/2017: Stage 0 (pTis (DCIS), pN0, cM0, ER: Positive, PR: Positive, HER2: Not assessed ) - Signed by Alease Medina, PA-C on 02/04/2017  ?  CHIEF COMPLAINT:?????  2.4 cm, pTis N0 M0?, Stage 0, ?Ductal Carcinoma in- Situ of the Left Breast.  Location: Left Breast, 6;00 Position, 4 cm From the Nipple.  Grade 2.  Patterrn: ?Solid and Solid Papillary.  Negative Margins.  ER+?(99%)  PR+?(96%)  Ki-67 index: ?18%  ?  PREVIOUS PROCEDURES / TREATMENTS:  12/10/16 ?Left Breast Core Biopsy [facility]).  01/28/17 ?Left Breast Lumpectomy ([facility]).  04/21/17 ?Completed Left Breast Hypofractionated Radiation to 5205 cGy.  07/10/17??Tamoxifen post Radiation per Dr. Willette Pa.  11/2017: Discontinue tamoxifen  June/2020 initiated letrozole but tolerated this just for a few weeks.    Social History     Socioeconomic History   ? Marital status: Married   Tobacco Use   ? Smoking status: Never   ? Smokeless tobacco: Never   Substance and Sexual Activity   ? Alcohol use: No   ? Drug use: No     Medical History:   Diagnosis Date   ? Acquired hypothyroidism    ? Arthritis    ? Back pain    ? DM (diabetes mellitus), type 2 (HCC)    ? Fatty liver    ? Fibromyalgia    ? Fibromyalgia    ? Gastrointestinal disorder     colon polyps   ? Hearing reduced    ? Hepatitis 1960's    Hep A   ? HTN (hypertension)    ? Interstitial cystitis    ? Irregular heart beat     ST and BBB   ? Metabolic syndrome    ? Migraines    ? Mitral valve prolapse    ? Neuropathy    ? Osteoporosis    ? Sleep apnea     doesn't have CPAP machine   ? TIA (transient ischemic attack) [phone]    TIA's - pt states multiple    ? Vertigo    ? Vision decreased           Review of Systems   Constitutional: Negative for appetite change, fatigue and unexpected weight change.   Respiratory: Positive for shortness of breath (chronic unchanged). Negative for cough.    Cardiovascular: Negative for leg swelling.   Gastrointestinal: Negative for abdominal pain, constipation, diarrhea, nausea and vomiting.   Genitourinary: Positive for dysuria.   Musculoskeletal: Positive for myalgias (chronc muscle spasms). Negative for arthralgias.   Neurological: Positive for dizziness (with position change). Negative for syncope, weakness  and headaches.   Psychiatric/Behavioral: The patient is not nervous/anxious.          Objective:         ? amLODIPine (NORVASC) 10 mg tablet Take one tablet by mouth daily.   ? CALCIUM PO Take 1 tablet by mouth daily.   ? cholecalciferol (VITAMIN D3) 50 mcg (2,000 unit) tablet Take one tablet by mouth every 48 hours.   ? cyanocobalamin 1,000 mcg tablet Take one tablet by mouth daily.   ? d-mannose 500 mg cap    ? dapagliflozin (FARXIGA) 10 mg tablet Take one tablet by mouth daily.   ? ginger root (GINGER EXTRACT PO) Take  by mouth.   ? insulin aspart U-100 (NOVOLOG) 100 unit/mL injection Inject sixteen Units under the skin three times daily with meals. Plus 1-4 units sliding scale three times daily   ? LEVEMIR FLEXTOUCH U-100 INSULN 100 unit/mL (3 mL) injection pen Inject forty five Units under the skin daily.   ? levothyroxine (SYNTHROID) 125 mcg tablet Take one tablet by mouth daily 30 minutes before breakfast.   ? losartan(+) (COZAAR) 100 mg tablet Take one tablet by mouth daily.   ? nitrofurantoin macrocrystaL (MACRODANTIN) 50 mg capsule Take one capsule by mouth every 48 hours.   ? turmeric 400 mg cap      Vitals:    12/09/21 1247   BP: 126/63   BP Source: Arm, Left Upper   Pulse: 80   Temp: 37 ?C (98.6 ?F)   Resp: 18   SpO2: 97%   TempSrc: Temporal   PainSc: Two   Weight: 77.4 kg (170 lb 9.6 oz)     Body mass index is 31.2 kg/m?Marland Kitchen     Pain Score: Two       Pain Addressed:  N/A    Patient Evaluated for a Clinical Trial: Patient not eligible for a treatment trial (including not needing treatment, needs palliative care, in remission).     [place] Cooperative Oncology Group performance status is 0, Fully active, able to carry on all pre-disease performance without restriction..    Labs:  CBC w/Diff    Lab Results   Component Value Date/Time    WBC 8.3 07/25/2020 12:00 AM    RBC 4.33 07/25/2020 12:00 AM    HGB 13.6 07/25/2020 12:00 AM    HCT 41.2 07/25/2020 12:00 AM    MCV 95.2 07/25/2020 12:00 AM    MCH 31.3 (H) 07/25/2020 12:00 AM    MCHC 32.9 07/25/2020 12:00 AM    RDW 12.8 07/25/2020 12:00 AM    PLTCT 356 07/25/2020 12:00 AM    MPV 8.4 10/24/2018 02:37 PM    Lab Results   Component Value Date/Time    NEUT 72 10/24/2018 02:37 PM    ANC 5.70 10/24/2018 02:37 PM    LYMA 20 (L) 10/24/2018 02:37 PM    ALC 1.50 10/24/2018 02:37 PM    MONA 6 10/24/2018 02:37 PM    AMC 0.50 10/24/2018 02:37 PM    EOSA 1 10/24/2018 02:37 PM    AEC 0.10 10/24/2018 02:37 PM    BASA 1 10/24/2018 02:37 PM    ABC 0.10 10/24/2018 02:37 PM        Comprehensive Metabolic Profile    Lab Results   Component Value Date/Time    NA 138 07/25/2020 12:00 AM    K 3.6 07/25/2020 12:00 AM    CL 107 07/25/2020 12:00 AM    CO2 19.0 (L) 07/25/2020 12:00 AM  GAP 16 (H) 07/25/2020 12:00 AM    BUN 24.0 (H) 07/25/2020 12:00 AM    CR 1.16 (H) 07/25/2020 12:00 AM    GLU 116 (H) 05/13/2020 12:00 AM    Lab Results   Component Value Date/Time    CA 9.8 07/25/2020 12:00 AM    PO4 3.5 07/25/2020 12:00 AM    ALBUMIN 4.3 07/25/2020 12:00 AM    TOTPROT 6.9 04/18/2020 12:00 AM    ALKPHOS 83 04/18/2020 12:00 AM    AST 19 04/18/2020 12:00 AM    ALT 23 04/18/2020 12:00 AM    TOTBILI 1.45 (H) 04/18/2020 12:00 AM    GFR 49.2 (L) 07/25/2020 12:00 AM    GFRAA >60 07/19/2018 02:04 PM        Lab Results   Component Value Date/Time    IRON 66 03/27/2020 12:00 AM    TIBC 291 03/27/2020 12:00 AM    FERRITIN 291.94 (H) 03/27/2020 12:00 AM          Physical Exam  Vitals reviewed.   Constitutional:       Appearance: She is well-developed.   HENT:      Head: Normocephalic and atraumatic.   Cardiovascular:      Rate and Rhythm: Normal rate and regular rhythm.   Pulmonary:      Effort: Pulmonary effort is normal.   Chest:   Breasts:     Right: Normal.      Left: Tenderness present.   Abdominal:      General: Abdomen is flat. Bowel sounds are normal.      Palpations: Abdomen is soft.   Lymphadenopathy:      Cervical: No cervical adenopathy.      Upper Body:      Right upper body: No supraclavicular or axillary adenopathy.      Left upper body: No supraclavicular or axillary adenopathy.   Skin:     General: Skin is warm and dry.   Neurological:      General: No focal deficit present.      Mental Status: She is alert and oriented to person, place, and time. Mental status is at baseline.   Psychiatric:         Behavior: Behavior normal.         Thought Content: Thought content normal.         Judgment: Judgment normal.                 Assessment and Plan:         ICD-9-CM ICD-10-CM    1. Ductal carcinoma in situ (DCIS) of left breast  233.0 D05.12 ONCBCN CLINIC APPT REQUEST      2. Stage 3a chronic kidney disease (HCC)  585.3 N18.31           Ductal carcinoma in situ (DCIS) of left breast  DCIS left breast ER/PR positive diagnosed in 2018.  s/p left lumpectomy and radiation.  Did no tolerate endocrine therapy.      Has remained on observation.  Clinical breast exam WNL.  Chronically left breast tenderness.  Mammogram scheduled 01/2022.  Follow up in 1 year with surviviorship    Stage 3a chronic kidney disease (HCC)  Follows [facility]           Patient verbalized understanding and all questions were answered to their satisfaction.  Follow up in 1 year with NP.    Time spent reviewing previous notes, records and labs as well as face to face  with patient, documentation, entering orders and coordination of care was 30 minutes.

## 2022-01-15 ENCOUNTER — Encounter: Admit: 2022-01-15 | Discharge: 2022-01-15 | Payer: MEDICARE

## 2022-01-15 DIAGNOSIS — Z1231 Encounter for screening mammogram for malignant neoplasm of breast: Secondary | ICD-10-CM

## 2022-01-15 DIAGNOSIS — D0512 Intraductal carcinoma in situ of left breast: Secondary | ICD-10-CM

## 2022-03-16 ENCOUNTER — Encounter: Admit: 2022-03-16 | Discharge: 2022-03-16 | Payer: MEDICARE

## 2022-03-25 ENCOUNTER — Encounter: Admit: 2022-03-25 | Discharge: 2022-03-25 | Payer: MEDICARE

## 2022-03-31 ENCOUNTER — Encounter: Admit: 2022-03-31 | Discharge: 2022-03-31 | Payer: MEDICARE

## 2022-03-31 DIAGNOSIS — E1121 Type 2 diabetes mellitus with diabetic nephropathy: Secondary | ICD-10-CM

## 2022-03-31 DIAGNOSIS — Z136 Encounter for screening for cardiovascular disorders: Secondary | ICD-10-CM

## 2022-03-31 DIAGNOSIS — M797 Fibromyalgia: Secondary | ICD-10-CM

## 2022-03-31 DIAGNOSIS — E8881 Metabolic syndrome: Secondary | ICD-10-CM

## 2022-03-31 DIAGNOSIS — G4730 Sleep apnea, unspecified: Secondary | ICD-10-CM

## 2022-03-31 DIAGNOSIS — K760 Fatty (change of) liver, not elsewhere classified: Secondary | ICD-10-CM

## 2022-03-31 DIAGNOSIS — E039 Hypothyroidism, unspecified: Secondary | ICD-10-CM

## 2022-03-31 DIAGNOSIS — M549 Dorsalgia, unspecified: Secondary | ICD-10-CM

## 2022-03-31 DIAGNOSIS — R0602 Shortness of breath: Secondary | ICD-10-CM

## 2022-03-31 DIAGNOSIS — G43909 Migraine, unspecified, not intractable, without status migrainosus: Secondary | ICD-10-CM

## 2022-03-31 DIAGNOSIS — I499 Cardiac arrhythmia, unspecified: Secondary | ICD-10-CM

## 2022-03-31 DIAGNOSIS — K759 Inflammatory liver disease, unspecified: Secondary | ICD-10-CM

## 2022-03-31 DIAGNOSIS — I341 Nonrheumatic mitral (valve) prolapse: Secondary | ICD-10-CM

## 2022-03-31 DIAGNOSIS — I10 Essential (primary) hypertension: Secondary | ICD-10-CM

## 2022-03-31 DIAGNOSIS — G459 Transient cerebral ischemic attack, unspecified: Secondary | ICD-10-CM

## 2022-03-31 DIAGNOSIS — N3010 Interstitial cystitis (chronic) without hematuria: Secondary | ICD-10-CM

## 2022-03-31 DIAGNOSIS — G629 Polyneuropathy, unspecified: Secondary | ICD-10-CM

## 2022-03-31 DIAGNOSIS — K929 Disease of digestive system, unspecified: Secondary | ICD-10-CM

## 2022-03-31 DIAGNOSIS — R42 Dizziness and giddiness: Secondary | ICD-10-CM

## 2022-03-31 DIAGNOSIS — M810 Age-related osteoporosis without current pathological fracture: Secondary | ICD-10-CM

## 2022-03-31 DIAGNOSIS — H547 Unspecified visual loss: Secondary | ICD-10-CM

## 2022-03-31 DIAGNOSIS — H9190 Unspecified hearing loss, unspecified ear: Secondary | ICD-10-CM

## 2022-03-31 DIAGNOSIS — E119 Type 2 diabetes mellitus without complications: Secondary | ICD-10-CM

## 2022-03-31 DIAGNOSIS — M1990 Unspecified osteoarthritis, unspecified site: Secondary | ICD-10-CM

## 2022-03-31 MED ORDER — ROSUVASTATIN 5 MG PO TAB
2.5 mg | ORAL_TABLET | Freq: Every day | ORAL | 3 refills | 90.00000 days | Status: AC
Start: 2022-03-31 — End: ?

## 2022-03-31 MED ORDER — HYDROCHLOROTHIAZIDE 25 MG PO TAB
12.5 mg | ORAL_TABLET | Freq: Every morning | ORAL | 3 refills | 28.00000 days | Status: AC
Start: 2022-03-31 — End: ?

## 2022-03-31 NOTE — Progress Notes
Date of Service: 03/31/2022    Alexis Lewis is a 72 y.o. female.       Chief Complaint: Follow-up    History of Present Illness:     I had the pleasure of seeing Alexis Lewis in our [place] office this afternoon for cardiovascular followup.      As you know, Alexis Lewis is a very pleasant 72 year old female with history of left breast ductal carcinoma in situ, S/P lumpectomy and radiation therapy.  She has been intolerant to tamoxifen.  She also has a history of insulin-dependent type 2 diabetes mellitus, hypertension, and dyslipidemia.  I first met Alexis Lewis in March 2021 when she was referred for chest pain.  We ultimately felt that her pain was atypical, and given her low risk myocardial perfusion study on January 10, 2019, we opted not to pursue any further ischemic evaluation.  Fortunately her chest pain has completely resolved.  She has had no recurrence.  We did obtain a transthoracic echocardiogram on April 21, 2019, which demonstrated normal left ventricular size and systolic function.  There was mild concentric hypertrophy with mild diastolic dysfunction.  No significant valvular disease.  Normal right ventricular size and systolic function.      Since our last visit, Alexis Lewis has had a lot of stress in her life.  Her 25 year old mother is now living with her.  Not sure that her husband is always as helpful as she would like.  Alexis Lewis's son is unfortunately in and out of jail due to drug use.  From a symptom standpoint she has no concerns today.  She is not having chest pain or worsening shortness of breath.  No lightheadedness, dizziness, palpitations, orthopnea, paroxysmal nocturnal dyspnea, or lower extremity swelling.       Past Medical History:  Patient Active Problem List    Diagnosis Date Noted    Stage 3a chronic kidney disease (HCC) 10/18/2020    TIA (transient ischemic attack) 03/24/2019     12/12/2015 Carotid duplex:  No hemodynamically significant stenosis within the internal carotid arteries.  Antegrade flow within the vertebral arteries.        Hot flashes 07/20/2018    Insomnia 07/20/2018    Shortness of breath 01/11/2017    Preoperative testing 01/11/2017    Ductal carcinoma in situ (DCIS) of left breast 12/29/2016     DIAGNOSIS:  Left grade 2 DCIS (ER 99%, PR 99%) dx 12/2016     HISTORY:  Ms. Discher is a female who presented to the [facility] on 12/30/2016 at age 24 for left breast cancer. She presented for screening mammogram 05/11/2016 and was told there was an abnormality and 6 month follow up with recommended.  She returned for diagnostic mammogram and ultrasound 12/04/2016 which confirmed mass and biopsy was recommended.  She underwent sono-guided biopsy on 12/10/2016 which revealed grade 2 DCIS.  She proceeded with Left rsl lumpectomy on 01/28/2017.  Final surgical pathology revealed grade 2 DCIS measuring 2.4 cm with clear margins. She completed adjuvant radiation therapy on 04/21/2017.  She started Tamoxifen 07/2017.     BREAST IMAGING:  Mammogram:    - Screening mammogram ([place]) revealed a moderate heterogenous fibroglandular parenchymal pattern.  No dominant mass lesion or architectural change.  Several benign calcifications were identified with no evidence of new suspicious aggregate pleomorphic microcalcifications.   - Left diagnostic mammogram 05/28/2016 [facility]) revealed compression magnification views reveal no definite persistent concerning density. As a precaution repeat left mammogram in 6 months was recommended.   -  Left diagnostic mammogram 12/04/2016 ([place]) revealed a lobulated ovoid same mass in the left breast 4 cm FTN at the 6:00 position.  - Bilateral diagnostic mammogram 12/30/2016 ([place]) revealed both breast demonstrate scattered fibroglandular densities. The right breast appears normal. At 6:00 in the left breast approximately 4-5 cm from nipple there was a small hematoma and a clip marker from recent biopsy.  Ultrasound:   - Left breast ultrasound 12/04/2016 [facility]) revealed a hypoechoic irregular mass with mild vascularity on color doppler imaging at 6:00 position 3 cm FTN.  There was posterior shadowing. No abnormal lymphadenopathy.   - Left Targeted ultrasound 12/30/2016 ([place]) revealed at this location there was a small mass measuring approximately 8 mm x 5 mm x 7 mm with associated hematoma. The clip marker seen adjacent to the small mass. The findings were consistent with recently diagnosed malignancy. No other satellite lesions or suspicious findings were present.    REPRODUCTIVE HEALTH:  Age at first Menarche:  31  Age at First Live Birth:  33  Age at Menopause:  89  Gravida:  4  Para: 3  Breastfeeding:  no    PROCEDURE: Left rsl lumpectomy 01/28/2017 [facility])  PERTINENT PMH:  Fibromyalgia, colon polyps, osteoporosis, TIA-1980s, thyroidectomy, Mitral valve/tricuspid prolapse, RBBB, DM type 2, HLP  FAMILY HISTORY:  Maternal cousin - breast cancer (66). No family history of ovarian cancer   MEDICAL ONCOLOGY:    Dr. Willette Pa   Present Therapy: Tamoxifen started 07/10/2017, stopped. Given script for Femara but has not started  REFERRED BY:  Dr. Wilford Grist         Type 2 diabetes mellitus with diabetic nephropathy, with long-term current use of insulin (HCC)     HTN (hypertension)     Mitral valve prolapse     Irregular heart beat          Review of Systems   Constitutional: Negative.   HENT: Negative.     Eyes: Negative.    Cardiovascular: Negative.    Respiratory: Negative.     Endocrine: Negative.    Hematologic/Lymphatic: Negative.    Skin: Negative.    Musculoskeletal: Negative.    Gastrointestinal: Negative.    Genitourinary: Negative.    Neurological: Negative.    Psychiatric/Behavioral: Negative.     Allergic/Immunologic: Negative.        Vitals:    03/31/22 1257   BP: (!) 142/84   BP Source: Arm, Left Upper   Pulse: 94   SpO2: 97%   O2 Device: None (Room air)   PainSc: Zero   Weight: 77.6 kg (171 lb)   Height: 157.5 cm (5' 2)     Body mass index is 31.28 kg/m?Marland Kitchen    Physical Examination:  General Appearance: No acute distress. Fully alert and oriented.  Skin: Warm. No ulcers or xanthomas.   HEENT: Grossly unremarkable. Lips and oral mucosa without pallor or cyanosis. Moist mucous membranes.   Neck Veins: Normal jugular venous pressure. Neck veins are not distended.  Carotid Arteries: Normal carotid upstroke bilaterally. No bruits.  Chest Inspection: Chest is normal in appearance.  Auscultation/Percussion: Normal respiratory effort. Lungs clear to auscultation bilaterally. No wheezes, rales, or rhonchi.    Cardiac Rhythm: Regular rhythm. Normal rate.  Cardiac Auscultation: Normal S1 & S2. No S3 or S4. No rub.  Murmurs: No cardiac murmurs.  Peripheral Circulation: Normal peripheral circulation.   Abdominal Aorta: No abdominal aortic bruit.  Extremities: Appropriately warm to touch. No lower extremity edema.  Abdominal Exam: Soft, non-tender.  No masses, no organomegaly. Normal bowel sounds.  Neurologic Exam: Neurological assessment grossly intact.       Assessment and Plan:    Hypertension:  Blood pressure has been a little high here recently.  She thinks this may be related to stress in her life.  Her 50 year old mother is now living with her and her son has been in and out of jail due to drug use.  Nevertheless she is interested in restarting her hydrochlorothiazide at 12.5 mg daily.  Will go ahead and prescribe this today.  Continue her other antihypertensives for now.  Dyslipidemia:  Uncontrolled.  She has a history of statin intolerance due to muscle aches, but is surprisingly willing to try a low-dose rosuvastatin again today.  She tells me that Medicare wouldn't pay for PCSK9 inhibitors.  I have discussed Zetia with her in the past, but she has politely declined any additions in medications.  I advised dietary and exercise modification.  Ideally, her LDL would be less than 70 given her type 2 diabetes mellitus.  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  No symptoms of lightheadedness, dizziness, or passing out.  Continue to monitor.  Type 2 diabetes mellitus:  She is insulin dependent.      I really enjoyed seeing Alexis Lewis Lewis in the office today, and I appreciate the opportunity to take part in her care.  I will update you with the results of her stress testing once available.  Otherwise, I will plan to see her Lewis in about 6 months.  If I can be of any assistance in the interim, please do not hesitate to reach out with questions or concerns.         Total time spent on today's office visit was 35 minutes. This includes face-to-face in person visit with patient as well as non face-to-face time including review of the electronic medical record, outside records, labs, radiologic studies, cardiovascular studies, formulation of treatment plan, after visit summary, future disposition, personal discussions, and documentation.    Current Medications (including today's revisions)   amLODIPine (NORVASC) 10 mg tablet Take one tablet by mouth daily.    amLODIPine (NORVASC) 5 mg tablet Take one tablet by mouth daily.    CALCIUM PO Take 1 tablet by mouth daily.    cholecalciferol (VITAMIN D3) 50 mcg (2,000 unit) tablet Take one tablet by mouth every 48 hours.    cyanocobalamin 1,000 mcg tablet Take one tablet by mouth daily.    d-mannose 500 mg cap     dapagliflozin (FARXIGA) 10 mg tablet Take one tablet by mouth daily.    ginger root (GINGER EXTRACT PO) Take  by mouth.    insulin aspart U-100 (NOVOLOG) 100 unit/mL injection Inject sixteen Units under the skin three times daily with meals. Plus 1-4 units sliding scale three times daily    LEVEMIR FLEXTOUCH U-100 INSULN 100 unit/mL (3 mL) injection pen Inject forty five Units under the skin daily.    levothyroxine (SYNTHROID) 125 mcg tablet Take one tablet by mouth daily 30 minutes before breakfast.    losartan(+) (COZAAR) 100 mg tablet Take one tablet by mouth daily.    nitrofurantoin macrocrystaL (MACRODANTIN) 50 mg capsule Take one capsule by mouth every 48 hours.    turmeric 400 mg cap

## 2022-03-31 NOTE — Patient Instructions
HCTZ 12.64m daily  Crestor 2.510mdaily  BMP 1 week  Flp 3 months  Follow up in 1year  Follow up as directed.  Call sooner if issues.  Call the [facility] at [phone] Leave a detailed message for the nurse in [email or web address] with how we can assist you and we will call you back.

## 2022-12-14 ENCOUNTER — Encounter: Admit: 2022-12-14 | Discharge: 2022-12-14 | Payer: MEDICARE

## 2022-12-16 ENCOUNTER — Encounter: Admit: 2022-12-16 | Discharge: 2022-12-16 | Payer: MEDICARE

## 2022-12-16 DIAGNOSIS — D0512 Intraductal carcinoma in situ of left breast: Secondary | ICD-10-CM

## 2022-12-16 NOTE — Progress Notes
Date of Service: 12/16/2022               Reason for Visit:  Heme/Onc Care        Subjective:        History of Present Illness  Alexis Lewis is a 72 y.o. female is a patient here for hx of DCIS diagnosed in 2018.  She is currently on observation.   She denies any changes in health nor medications.  No changes in breast.  She continues to have chronic left  breast tenderness.  Has chronic muscle spasms.  She is unaccompanied.      Mammogram 06/2022: BIRAD 2     Oncology History:   Cancer Staging   Ductal carcinoma in situ (DCIS) of left breast  Staging form: Breast, AJCC 8th Edition  - Clinical stage from 12/30/2016: Stage 0 (cTis (DCIS), cN0, cM0, ER: Positive, PR: Positive, HER2: Not assessed ) - Signed by Alease Medina, PA-C on 12/30/2016  - Pathologic stage from 02/04/2017: Stage 0 (pTis (DCIS), pN0, cM0, ER: Positive, PR: Positive, HER2: Not assessed ) - Signed by Alease Medina, PA-C on 02/04/2017     CHIEF COMPLAINT:       2.4 cm, pTis N0 M0 , Stage 0,  Ductal Carcinoma in- Situ of the Left Breast.  Location: Left Breast, 6;00 Position, 4 cm From the Nipple.  Grade 2.  Patterrn:  Solid and Solid Papillary.  Negative Margins.  ER+ (99%)  PR+ (96%)  Ki-67 index:  18%     PREVIOUS PROCEDURES / TREATMENTS:  12/10/16  Left Breast Core Biopsy [facility]).  01/28/17  Left Breast Lumpectomy ([facility]).  04/21/17  Completed Left Breast Hypofractionated Radiation to 5205 cGy.  07/10/17  Tamoxifen post Radiation per Dr. Willette Pa.  11/2017: Discontinue tamoxifen  June/2020 initiated letrozole but tolerated this just for a few weeks.    Social History     Socioeconomic History    Marital status: Married   Tobacco Use    Smoking status: Never    Smokeless tobacco: Never   Substance and Sexual Activity    Alcohol use: No    Drug use: No     Past Medical History:   Diagnosis Date    Acquired hypothyroidism     Arthritis     Back pain     DM (diabetes mellitus), type 2 (HCC)     Fatty liver     Fibromyalgia Fibromyalgia     Gastrointestinal disorder     colon polyps    Hearing reduced     Hepatitis 1960's    Hep A    HTN (hypertension)     Interstitial cystitis     Irregular heart beat     ST and BBB    Metabolic syndrome     Migraines     Mitral valve prolapse     Neuropathy     Osteoporosis     Sleep apnea     doesn't have CPAP machine    TIA (transient ischemic attack) 1970's-1980's    TIA's - pt states multiple     Vertigo     Vision decreased           Review of Systems   Constitutional:  Negative for appetite change, diaphoresis, fatigue, fever and unexpected weight change.   HENT:  Negative for nosebleeds.    Respiratory:  Positive for shortness of breath (chronic unchanged). Negative for cough.    Cardiovascular:  Negative for leg swelling.  Gastrointestinal:  Negative for abdominal pain, constipation, diarrhea, nausea and vomiting.   Musculoskeletal:  Positive for myalgias (chronc muscle spasms). Negative for arthralgias.   Neurological:  Negative for syncope, weakness and headaches.   Psychiatric/Behavioral:  The patient is not nervous/anxious.          Objective:          amLODIPine (NORVASC) 10 mg tablet Take one tablet by mouth daily.    CALCIUM PO Take 1 tablet by mouth daily.    cholecalciferol (VITAMIN D3) 50 mcg (2,000 unit) tablet Take one tablet by mouth every 48 hours.    cyanocobalamin 1,000 mcg tablet Take one tablet by mouth daily.    d-mannose 500 mg cap     ginger root (GINGER EXTRACT PO) Take  by mouth.    hydroCHLOROthiazide (HYDRODIURIL) 25 mg tablet Take one-half tablet by mouth every morning.    insulin aspart U-100 (NOVOLOG) 100 unit/mL injection Inject sixteen Units under the skin three times daily with meals. Plus 1-4 units sliding scale three times daily    LEVEMIR FLEXTOUCH U-100 INSULN 100 unit/mL (3 mL) injection pen Inject forty five Units under the skin daily.    levothyroxine (SYNTHROID) 125 mcg tablet Take one tablet by mouth daily 30 minutes before breakfast.    losartan(+) (COZAAR) 100 mg tablet Take one tablet by mouth daily.     Vitals:    12/16/22 1321   BP: 137/72   BP Source: Arm, Left Upper   Pulse: 85   Temp: 36.3 ?C (97.3 ?F)   Resp: 18   SpO2: 95%   TempSrc: Temporal   PainSc: Zero   Weight: 77.9 kg (171 lb 12.8 oz)   Height: 157.5 cm (5' 2.01)       Body mass index is 31.41 kg/m?Marland Kitchen     Pain Score: Zero       Pain Addressed:  N/A    Patient Evaluated for a Clinical Trial: Patient not eligible for a treatment trial (including not needing treatment, needs palliative care, in remission).     [place] Cooperative Oncology Group performance status is 0, Fully active, able to carry on all pre-disease performance without restriction..    Labs:  CBC w/Diff    Lab Results   Component Value Date/Time    WBC 7.49 08/07/2021 12:00 AM    RBC 4.97 08/07/2021 12:00 AM    HGB 14.7 08/07/2021 12:00 AM    HCT 44.1 08/07/2021 12:00 AM    MCV 88.7 08/07/2021 12:00 AM    MCH 29.6 08/07/2021 12:00 AM    MCHC 33.3 08/07/2021 12:00 AM    RDW 45.0 08/07/2021 12:00 AM    PLTCT 322 08/07/2021 12:00 AM    MPV 10.0 08/07/2021 12:00 AM    Lab Results   Component Value Date/Time    NEUT 72 10/24/2018 02:37 PM    ANC 5.70 10/24/2018 02:37 PM    LYMA 20 (L) 10/24/2018 02:37 PM    ALC 1.50 10/24/2018 02:37 PM    MONA 6 10/24/2018 02:37 PM    AMC 0.50 10/24/2018 02:37 PM    EOSA 1 10/24/2018 02:37 PM    AEC 0.10 10/24/2018 02:37 PM    BASA 1 10/24/2018 02:37 PM    ABC 0.10 10/24/2018 02:37 PM        Comprehensive Metabolic Profile    Lab Results   Component Value Date/Time    NA 137 08/07/2021 12:00 AM    K 3.7 08/07/2021 12:00 AM  CL 108 (H) 08/07/2021 12:00 AM    CO2 18.0 (L) 08/07/2021 12:00 AM    GAP 16 (H) 07/25/2020 12:00 AM    BUN 24.0 (H) 08/07/2021 12:00 AM    CR 1.12 (H) 08/07/2021 12:00 AM    GLU 143 (H) 08/07/2021 12:00 AM    Lab Results   Component Value Date/Time    CA 10.0 08/07/2021 12:00 AM    PO4 3.5 07/25/2020 12:00 AM    ALBUMIN 4.1 08/07/2021 12:00 AM    TOTPROT 7.6 08/07/2021 12:00 AM ALKPHOS 111 08/07/2021 12:00 AM    AST 19 08/07/2021 12:00 AM    ALT 23 08/07/2021 12:00 AM    TOTBILI 1.55 (H) 08/07/2021 12:00 AM    GFR 49.2 (L) 07/25/2020 12:00 AM    GFRAA >60 07/19/2018 02:04 PM        Lab Results   Component Value Date/Time    IRON 66 03/27/2020 12:00 AM    TIBC 291 03/27/2020 12:00 AM    FERRITIN 291.94 (H) 03/27/2020 12:00 AM          Physical Exam  Vitals reviewed.   Constitutional:       Appearance: She is well-developed.   HENT:      Head: Normocephalic and atraumatic.   Cardiovascular:      Rate and Rhythm: Normal rate and regular rhythm.   Pulmonary:      Effort: Pulmonary effort is normal.   Chest:   Breasts:     Right: Tenderness present.      Left: Tenderness present.   Abdominal:      General: Abdomen is flat. Bowel sounds are normal.      Palpations: Abdomen is soft.   Lymphadenopathy:      Cervical: No cervical adenopathy.      Upper Body:      Right upper body: No supraclavicular or axillary adenopathy.      Left upper body: No supraclavicular or axillary adenopathy.   Skin:     General: Skin is warm and dry.   Neurological:      General: No focal deficit present.      Mental Status: She is alert and oriented to person, place, and time. Mental status is at baseline.   Psychiatric:         Behavior: Behavior normal.         Thought Content: Thought content normal.         Judgment: Judgment normal.                 Assessment and Plan:         ICD-9-CM ICD-10-CM    1. Ductal carcinoma in situ (DCIS) of left breast  233.0 D05.12 [facility] CLINIC APPT REQUEST            Ductal carcinoma in situ (DCIS) of left breast  DCIS left breast ER/PR positive diagnosed in 2018.  s/p left lumpectomy and radiation.  Did no tolerate endocrine therapy.       Has remained on observation.  Clinical breast exam WNL.  Chronically left breast tenderness.  No changes.  Mammogram done 06/2022, BIRAD 2.  Follow up in 1 year with surviviorship           Patient verbalized understanding and all questions were answered to their satisfaction.  Follow up in 1 year with NP.

## 2023-05-03 ENCOUNTER — Encounter: Admit: 2023-05-03 | Discharge: 2023-05-03 | Payer: MEDICARE

## 2023-05-04 ENCOUNTER — Encounter: Admit: 2023-05-04 | Discharge: 2023-05-04 | Payer: MEDICARE

## 2023-12-15 NOTE — Progress Notes [1]
 Date of Service: 12/16/2023               Reason for Visit:  Heme/Onc Care        Subjective:        History of Present Illness  Alexis Lewis is a 73 y.o. female is a patient here for hx of DCIS diagnosed in 2018.  She is currently on observation.   She denies any changes in health nor medications.  She has chronic left breast pain, but feels like there are changes in the left breast now.  She thinks she can feel a lump.  About 1 month ago it was hard, tender and swollen.  That has resolved, but she can feel a lump. No headaches, weight loss, new pain, nausea, vomiting.  She is unaccompanied.      Mammogram 06/2022: BIRAD 2     Oncology History:   Cancer Staging   Ductal carcinoma in situ (DCIS) of left breast  Staging form: Breast, AJCC 8th Edition  - Clinical stage from 12/30/2016: Stage 0 (cTis (DCIS), cN0, cM0, ER: Positive, PR: Positive, HER2: Not assessed ) - Signed by Margaret Domino, PA-C on 12/30/2016  - Pathologic stage from 02/04/2017: Stage 0 (pTis (DCIS), pN0, cM0, ER: Positive, PR: Positive, HER2: Not assessed ) - Signed by Margaret Domino, PA-C on 02/04/2017     CHIEF COMPLAINT:       2.4 cm, pTis N0 M0 , Stage 0,  Ductal Carcinoma in- Situ of the Left Breast.  Location: Left Breast, 6;00 Position, 4 cm From the Nipple.  Grade 2.  Patterrn:  Solid and Solid Papillary.  Negative Margins.  ER+ (99%)  PR+ (96%)  Ki-67 index:  18%     PREVIOUS PROCEDURES / TREATMENTS:  12/10/16  Left Breast Core Biopsy [facility]).  01/28/17  Left Breast Lumpectomy ([facility]).  04/21/17  Completed Left Breast Hypofractionated Radiation to 5205 cGy.  07/10/17  Tamoxifen  post Radiation per Dr. Jackee.  11/2017: Discontinue tamoxifen   June/2020 initiated letrozole  but tolerated this just for a few weeks.    Social History     Socioeconomic History    Marital status: Married   Tobacco Use    Smoking status: Never    Smokeless tobacco: Never   Substance and Sexual Activity    Alcohol use: Never    Drug use: Never    Sexual activity: Not Currently     Partners: Male     Birth control/protection: Pill     Past Medical History:    Acquired hypothyroidism    Arthritis    Back pain    DM (diabetes mellitus), type 2 (CMS-HCC)    Dyspnea    Fatty liver    Fibromyalgia    Fibromyalgia    Gastrointestinal disorder    GERD (gastroesophageal reflux disease)    Hearing reduced    Hepatitis    HTN (hypertension)    Hypertriglyceridemia    Interstitial cystitis    Irregular heart beat    Kidney disease    Metabolic syndrome    Migraines    Mitral valve prolapse    Neuropathy    Osteoporosis    Postmenopausal    Sleep apnea    Tachyarrhythmia    TIA (transient ischemic attack)    Valvular heart disease    Vertigo    Vision decreased          Review of Systems   Constitutional:  Negative for appetite change, diaphoresis, fatigue, fever and  unexpected weight change.   HENT:  Negative for nosebleeds.    Respiratory:  Positive for shortness of breath (chronic unchanged). Negative for cough.    Cardiovascular:  Negative for leg swelling.   Gastrointestinal:  Negative for abdominal pain, constipation, diarrhea, nausea and vomiting.   Musculoskeletal:  Positive for myalgias (chronc muscle spasms). Negative for arthralgias.   Neurological:  Negative for syncope, weakness and headaches.   Psychiatric/Behavioral:  The patient is not nervous/anxious.          Objective:          amLODIPine (NORVASC) 10 mg tablet Take one tablet by mouth daily.    BASAGLAR KWIKPEN U-100 INSULIN 100 unit/mL (3 mL) subcutaneous PEN INJECT 16 TO 18 UNITS SUBCUTANEOUSLY ONCE DAILY IN THE EVENING    CALCIUM PO Take 1 tablet by mouth daily.    cholecalciferol (VITAMIN D3) 50 mcg (2,000 unit) tablet Take one tablet by mouth every 48 hours.    cyanocobalamin 1,000 mcg tablet Take one tablet by mouth daily. (Patient not taking: Reported on 12/16/2023)    d-mannose 500 mg cap     ginger root (GINGER EXTRACT PO) Take  by mouth.    HUMALOG KWIKPEN INSULIN 100 unit/mL subcutaneous PEN Inject eighteen Units under the skin.    hydroCHLOROthiazide  (HYDRODIURIL ) 25 mg tablet Take one-half tablet by mouth every morning.    insulin aspart U-100 (NOVOLOG) 100 unit/mL injection Inject sixteen Units under the skin three times daily with meals. Plus 1-4 units sliding scale three times daily    LEVEMIR FLEXTOUCH U-100 INSULN 100 unit/mL (3 mL) injection pen Inject forty five Units under the skin daily.    levothyroxine (SYNTHROID) 125 mcg tablet Take one tablet by mouth daily 30 minutes before breakfast.    losartan(+) (COZAAR) 100 mg tablet Take one tablet by mouth daily.    nitrofurantoin macrocrystaL (MACRODANTIN) 100 mg capsule Take one capsule by mouth twice daily.     Vitals:    12/16/23 1308   BP: (!) 159/76   BP Source: Arm, Left Upper   Pulse: 76   Temp: 36.8 ?C (98.2 ?F)   Resp: 18   SpO2: 97%   TempSrc: Temporal   PainSc: Zero   Weight: 78.6 kg (173 lb 3.2 oz)         Body mass index is 31.68 kg/m?SABRA     Pain Score: Zero       Pain Addressed:  N/A    Patient Evaluated for a Clinical Trial: Patient not eligible for a treatment trial (including not needing treatment, needs palliative care, in remission).     [place] Cooperative Oncology Group performance status is 0, Fully active, able to carry on all pre-disease performance without restriction..    Labs:  CBC w/Diff    Lab Results   Component Value Date/Time    WBC 7.49 08/07/2021 12:00 AM    RBC 4.97 08/07/2021 12:00 AM    HGB 14.7 08/07/2021 12:00 AM    HCT 44.1 08/07/2021 12:00 AM    MCV 88.7 08/07/2021 12:00 AM    MCH 29.6 08/07/2021 12:00 AM    MCHC 33.3 08/07/2021 12:00 AM    RDW 45.0 08/07/2021 12:00 AM    PLTCT 322 08/07/2021 12:00 AM    MPV 10.0 08/07/2021 12:00 AM    Lab Results   Component Value Date/Time    NEUT 72 10/24/2018 02:37 PM    ANC 5.70 10/24/2018 02:37 PM    LYMA 20 (L) 10/24/2018 02:37 PM  ALC 1.50 10/24/2018 02:37 PM    MONA 6 10/24/2018 02:37 PM    AMC 0.50 10/24/2018 02:37 PM    EOSA 1 10/24/2018 02:37 PM    AEC 0.10 10/24/2018 02:37 PM    BASA 1 10/24/2018 02:37 PM    ABC 0.10 10/24/2018 02:37 PM        Comprehensive Metabolic Profile    Lab Results   Component Value Date/Time    NA 142 09/09/2022 12:00 AM    K 3.8 09/09/2022 12:00 AM    CL 109 (H) 09/09/2022 12:00 AM    CO2 21.0 (L) 09/09/2022 12:00 AM    GAP 12 09/09/2022 12:00 AM    BUN 17.9 09/09/2022 12:00 AM    CR 0.98 09/09/2022 12:00 AM    GLU 147 (H) 09/09/2022 12:00 AM    Lab Results   Component Value Date/Time    CA 9.4 09/09/2022 12:00 AM    PO4 3.5 07/25/2020 12:00 AM    ALBUMIN 4.0 09/09/2022 12:00 AM    TOTPROT 6.8 09/09/2022 12:00 AM    ALKPHOS 94 09/09/2022 12:00 AM    AST 18 09/09/2022 12:00 AM    ALT 23 09/09/2022 12:00 AM    TOTBILI 1.39 (H) 09/09/2022 12:00 AM    GFR 49.2 (L) 07/25/2020 12:00 AM    GFRAA >60 07/19/2018 02:04 PM        Lab Results   Component Value Date/Time    IRON 66 03/27/2020 12:00 AM    TIBC 291 03/27/2020 12:00 AM    FERRITIN 291.94 (H) 03/27/2020 12:00 AM          Physical Exam  Vitals reviewed.   Constitutional:       Appearance: She is well-developed.   HENT:      Head: Normocephalic and atraumatic.   Cardiovascular:      Rate and Rhythm: Normal rate and regular rhythm.   Pulmonary:      Effort: Pulmonary effort is normal.   Chest:   Breasts:     Right: Normal.      Left: Mass and tenderness present. No swelling, inverted nipple, nipple discharge or skin change.       Abdominal:      General: Abdomen is flat. Bowel sounds are normal.      Palpations: Abdomen is soft.   Lymphadenopathy:      Cervical: No cervical adenopathy.      Upper Body:      Right upper body: No supraclavicular or axillary adenopathy.      Left upper body: No supraclavicular or axillary adenopathy.   Skin:     General: Skin is warm and dry.   Neurological:      General: No focal deficit present.      Mental Status: She is alert and oriented to person, place, and time. Mental status is at baseline.   Psychiatric:         Behavior: Behavior normal.         Thought Content: Thought content normal.         Judgment: Judgment normal.                 Assessment and Plan:         ICD-9-CM ICD-10-CM    1. Ductal carcinoma in situ (DCIS) of left breast  233.0 D05.12 [facility] CLINIC APPT REQUEST      MAMMO DIAGNOSTIC BIL/TOMO      US  BREAST COMPLETE LT  [facility] CLINIC APPT REQUEST      2. Type 2 diabetes mellitus with diabetic nephropathy, with long-term current use of insulin (CMS-HCC)  250.40 E11.21     583.81 Z79.4     V58.67        3. Mass of lower inner quadrant of left breast  611.72 N63.24 MAMMO DIAGNOSTIC BIL/TOMO      US  BREAST COMPLETE LT      [facility] CLINIC APPT REQUEST            Ductal carcinoma in situ (DCIS) of left breast  DCIS left breast ER/PR positive diagnosed in 2018.  s/p left lumpectomy and radiation.  Did no tolerate endocrine therapy.  Mammogram done 06/2022, BIRAD 2.     Has remained on observation.  Chronically left breast tenderness. Clinical breast exam reveals palpable lump in left breast at 3 oclock position.  Overdue for mammogram.  Will set up mammogram and left breast ultrasound.    Follow up in 1 year with surviviorship             Patient verbalized understanding and all questions were answered to their satisfaction.  Follow up in 1 year with NP.

## 2023-12-16 ENCOUNTER — Encounter: Admit: 2023-12-16 | Discharge: 2023-12-16 | Payer: MEDICARE

## 2023-12-16 VITALS — BP 159/76 | HR 76 | Temp 98.20000°F | Resp 18 | Wt 173.2 lb

## 2023-12-16 DIAGNOSIS — E1121 Type 2 diabetes mellitus with diabetic nephropathy: Secondary | ICD-10-CM

## 2023-12-16 DIAGNOSIS — N6324 Unspecified lump in the left breast, lower inner quadrant: Secondary | ICD-10-CM

## 2023-12-16 DIAGNOSIS — D0512 Intraductal carcinoma in situ of left breast: Principal | ICD-10-CM

## 2023-12-16 NOTE — Assessment & Plan Note [38]
 DCIS left breast ER/PR positive diagnosed in 2018.  s/p left lumpectomy and radiation.  Did no tolerate endocrine therapy.  Mammogram done 06/2022, BIRAD 2.     Has remained on observation.  Chronically left breast tenderness. Clinical breast exam reveals palpable lump in left breast at 3 oclock position.  Overdue for mammogram.  Will set up mammogram and left breast ultrasound.    Follow up in 1 year with surviviorship

## 2024-02-07 ENCOUNTER — Encounter: Admit: 2024-02-07 | Discharge: 2024-02-07 | Payer: MEDICARE

## 2024-02-08 ENCOUNTER — Encounter: Admit: 2024-02-08 | Discharge: 2024-02-08 | Payer: MEDICARE

## 2024-02-08 DIAGNOSIS — D0512 Intraductal carcinoma in situ of left breast: Principal | ICD-10-CM

## 2024-02-08 DIAGNOSIS — N6324 Unspecified lump in the left breast, lower inner quadrant: Secondary | ICD-10-CM

## 2024-02-21 ENCOUNTER — Encounter: Admit: 2024-02-21 | Discharge: 2024-02-21 | Payer: MEDICARE

## 2024-02-22 ENCOUNTER — Encounter: Admit: 2024-02-22 | Discharge: 2024-02-22 | Payer: MEDICARE

## 2024-02-24 ENCOUNTER — Encounter: Admit: 2024-02-24 | Discharge: 2024-02-24 | Payer: MEDICARE

## 2024-03-07 ENCOUNTER — Encounter: Admit: 2024-03-07 | Discharge: 2024-03-07 | Payer: MEDICARE

## 2024-03-08 ENCOUNTER — Encounter: Admit: 2024-03-08 | Discharge: 2024-03-08 | Payer: MEDICARE

## 2024-03-10 ENCOUNTER — Encounter: Admit: 2024-03-10 | Discharge: 2024-03-10 | Payer: MEDICARE

## 2024-03-15 ENCOUNTER — Encounter: Admit: 2024-03-15 | Discharge: 2024-03-15 | Payer: MEDICARE
